# Patient Record
Sex: Female | Born: 1980 | Race: White | Hispanic: No | Marital: Married | Smoking: Former smoker
Health system: Southern US, Community
[De-identification: ages and names within clinical notes are randomized; demographics above are authoritative.]

## PROBLEM LIST (undated history)

## (undated) DIAGNOSIS — R42 Dizziness and giddiness: Secondary | ICD-10-CM

## (undated) DIAGNOSIS — G7000 Myasthenia gravis without (acute) exacerbation: Secondary | ICD-10-CM

## (undated) DIAGNOSIS — R0602 Shortness of breath: Secondary | ICD-10-CM

## (undated) DIAGNOSIS — J45909 Unspecified asthma, uncomplicated: Secondary | ICD-10-CM

## (undated) DIAGNOSIS — E039 Hypothyroidism, unspecified: Secondary | ICD-10-CM

## (undated) HISTORY — DX: Shortness of breath: R06.02

## (undated) HISTORY — DX: Myasthenia gravis without (acute) exacerbation: G70.00

## (undated) HISTORY — DX: Dizziness and giddiness: R42

---

## 2000-06-29 HISTORY — PX: CHOLECYSTECTOMY: SHX55

## 2003-06-30 HISTORY — PX: THYMECTOMY: SHX1063

## 2005-06-17 ENCOUNTER — Ambulatory Visit (HOSPITAL_COMMUNITY): Admission: RE | Admit: 2005-06-17 | Discharge: 2005-06-17 | Payer: Self-pay | Admitting: Neurology

## 2005-10-05 ENCOUNTER — Ambulatory Visit (HOSPITAL_COMMUNITY): Admission: RE | Admit: 2005-10-05 | Discharge: 2005-10-05 | Payer: Self-pay | Admitting: Thoracic Surgery

## 2005-10-06 ENCOUNTER — Encounter (HOSPITAL_COMMUNITY): Admission: RE | Admit: 2005-10-06 | Discharge: 2006-01-04 | Payer: Self-pay | Admitting: Neurology

## 2005-10-09 ENCOUNTER — Inpatient Hospital Stay (HOSPITAL_COMMUNITY): Admission: RE | Admit: 2005-10-09 | Discharge: 2005-10-20 | Payer: Self-pay | Admitting: Thoracic Surgery

## 2005-10-28 ENCOUNTER — Encounter: Admission: RE | Admit: 2005-10-28 | Discharge: 2005-10-28 | Payer: Self-pay | Admitting: Thoracic Surgery

## 2005-11-11 ENCOUNTER — Encounter: Admission: RE | Admit: 2005-11-11 | Discharge: 2005-11-11 | Payer: Self-pay | Admitting: Thoracic Surgery

## 2007-09-01 IMAGING — CR DG CHEST 1V PORT
1 series · 1 of 1 positions shown · non-contrast
Comparison: none

CLINICAL DATA: Myasthenia gravis.  Central line placement. 
 PORTABLE CHEST - 1 VIEW 10/05/05 AT 3110 HOURS:

[view not recorded]
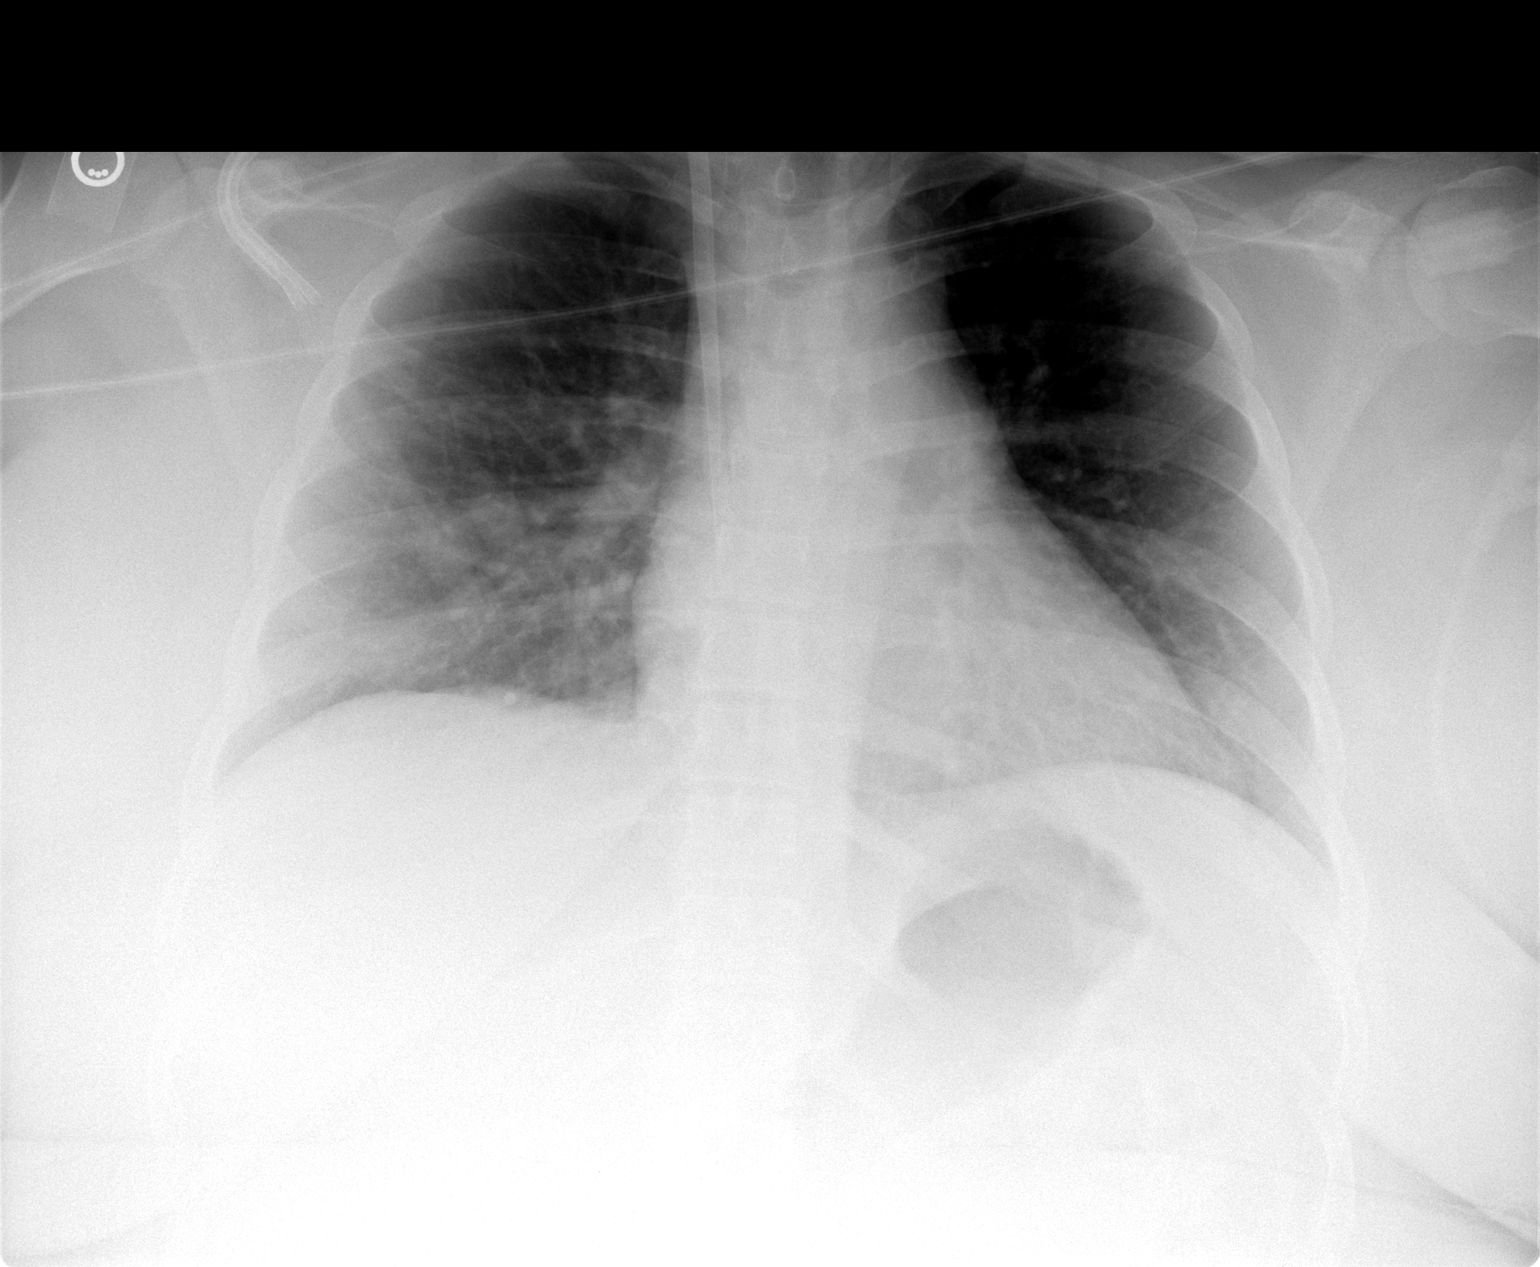

[1 of 1 positions shown; findings below may reference images not displayed]

FINDINGS: Compared to prior study at 0914 hours, there has been interval placement of a right jugular Diatek central venous catheter with tips in the distal superior vena cava and proximal right atrium. There is no evidence of pneumothorax.  
 Low lung volumes are seen however there is no evidence of acute infiltrate or pleural effusion.  Heart size is normal.
IMPRESSION: 1.  Right jugular dual lumen central venous catheter tips in the distal SVC and proximal right atrium.  No evidence of pneumothorax.  
 2.  No acute cardiopulmonary disease.

## 2007-09-08 IMAGING — CR DG CHEST 1V PORT
1 series · 1 of 1 positions shown · non-contrast
Comparison: 10/10/05.

CLINICAL DATA: Follow-up chest tube.  Myasthenia gravis.
 PORTABLE CHEST - 1 VIEW - 10/12/05:

[view not recorded]
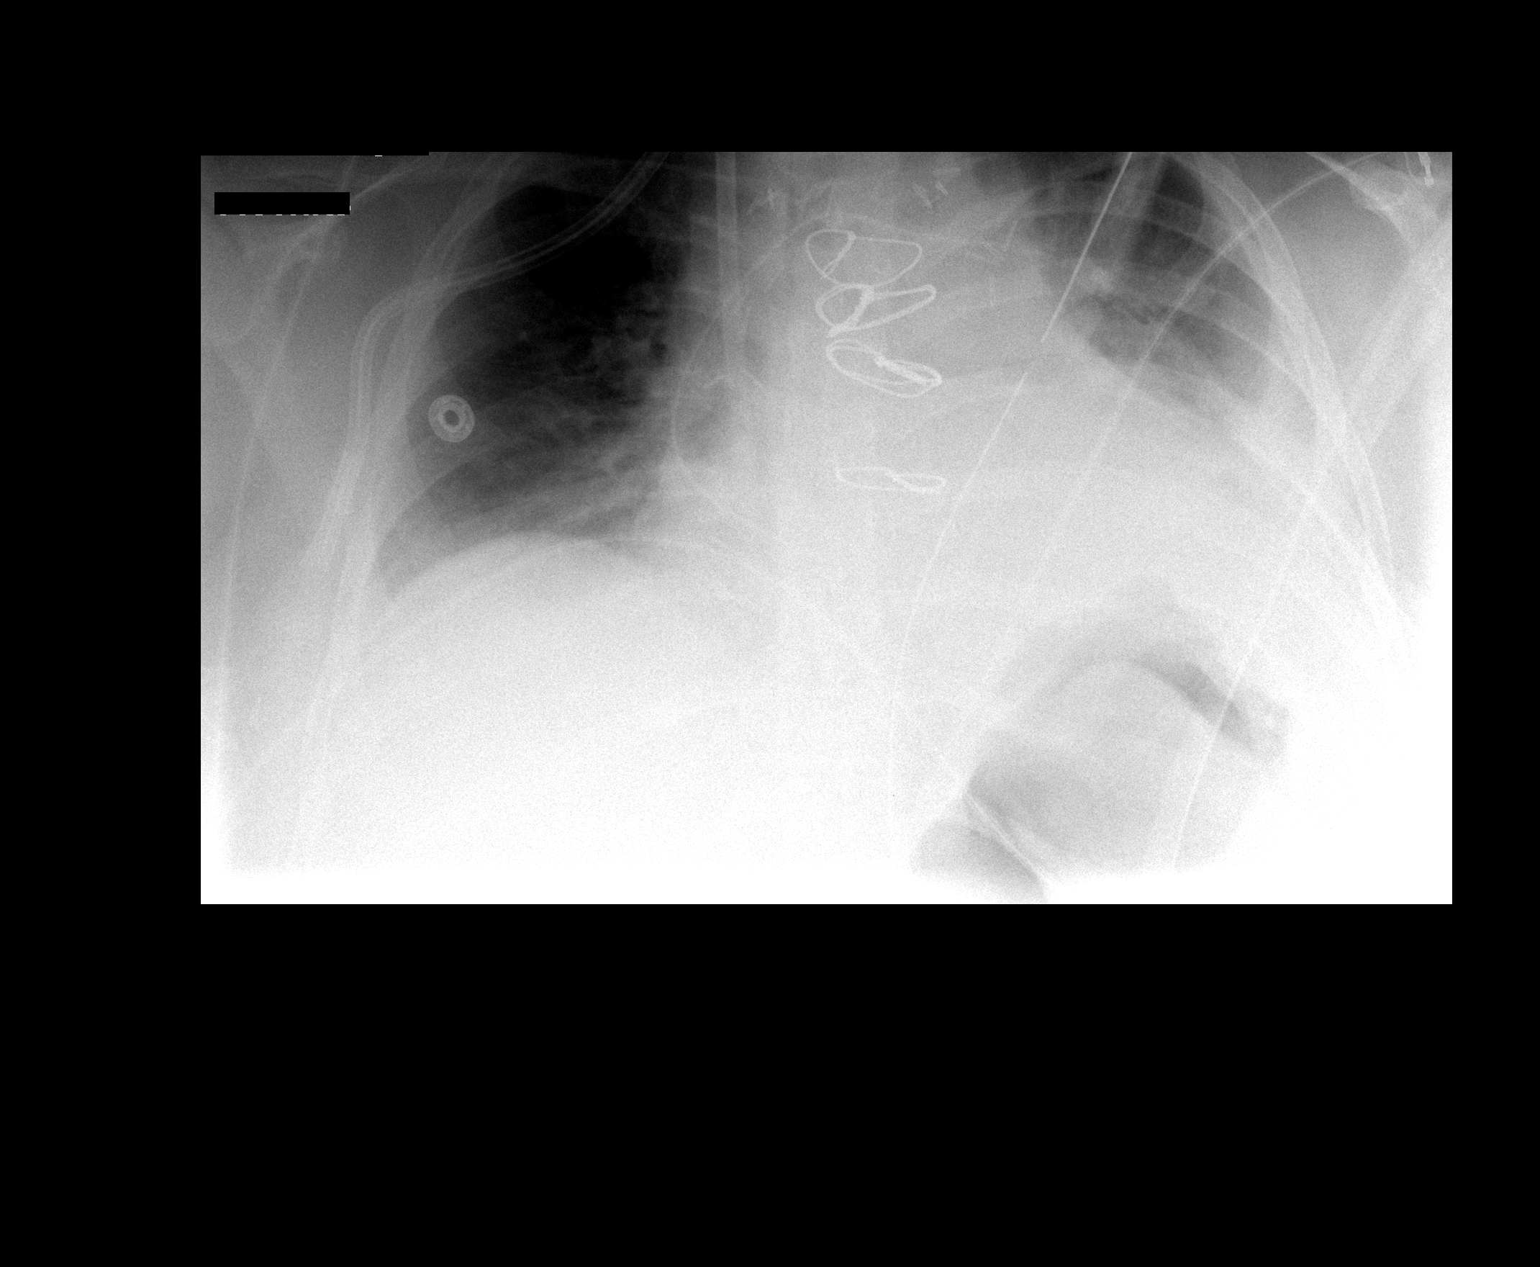

[1 of 1 positions shown; findings below may reference images not displayed]

FINDINGS: There is a large bore right IJ catheter which is unchanged in position.  The left-sided chest tube is in a similar position.  The heart is moderately enlarged and accentuated by a poor inspiratory effort.  There is slight increase in left base air space disease with a similar small left pleural effusion.  There is mild right base atelectasis as well.  No pneumothorax.  There is mild pulmonary venous congestion.
IMPRESSION: 1.  Slight decrease in lung aeration with increase in left base atelectasis and increased pulmonary venous congestion.
 2.  Support apparatus as above without evidence of pneumothorax.

## 2007-09-09 IMAGING — CR DG CHEST 1V PORT
1 series · 1 of 1 positions shown · non-contrast
Comparison: 10/12/05.

CLINICAL DATA: Status post VATS.  Chest tube in place.
 PORTABLE CHEST - 1 VIEW - 10/13/05:

[view not recorded]
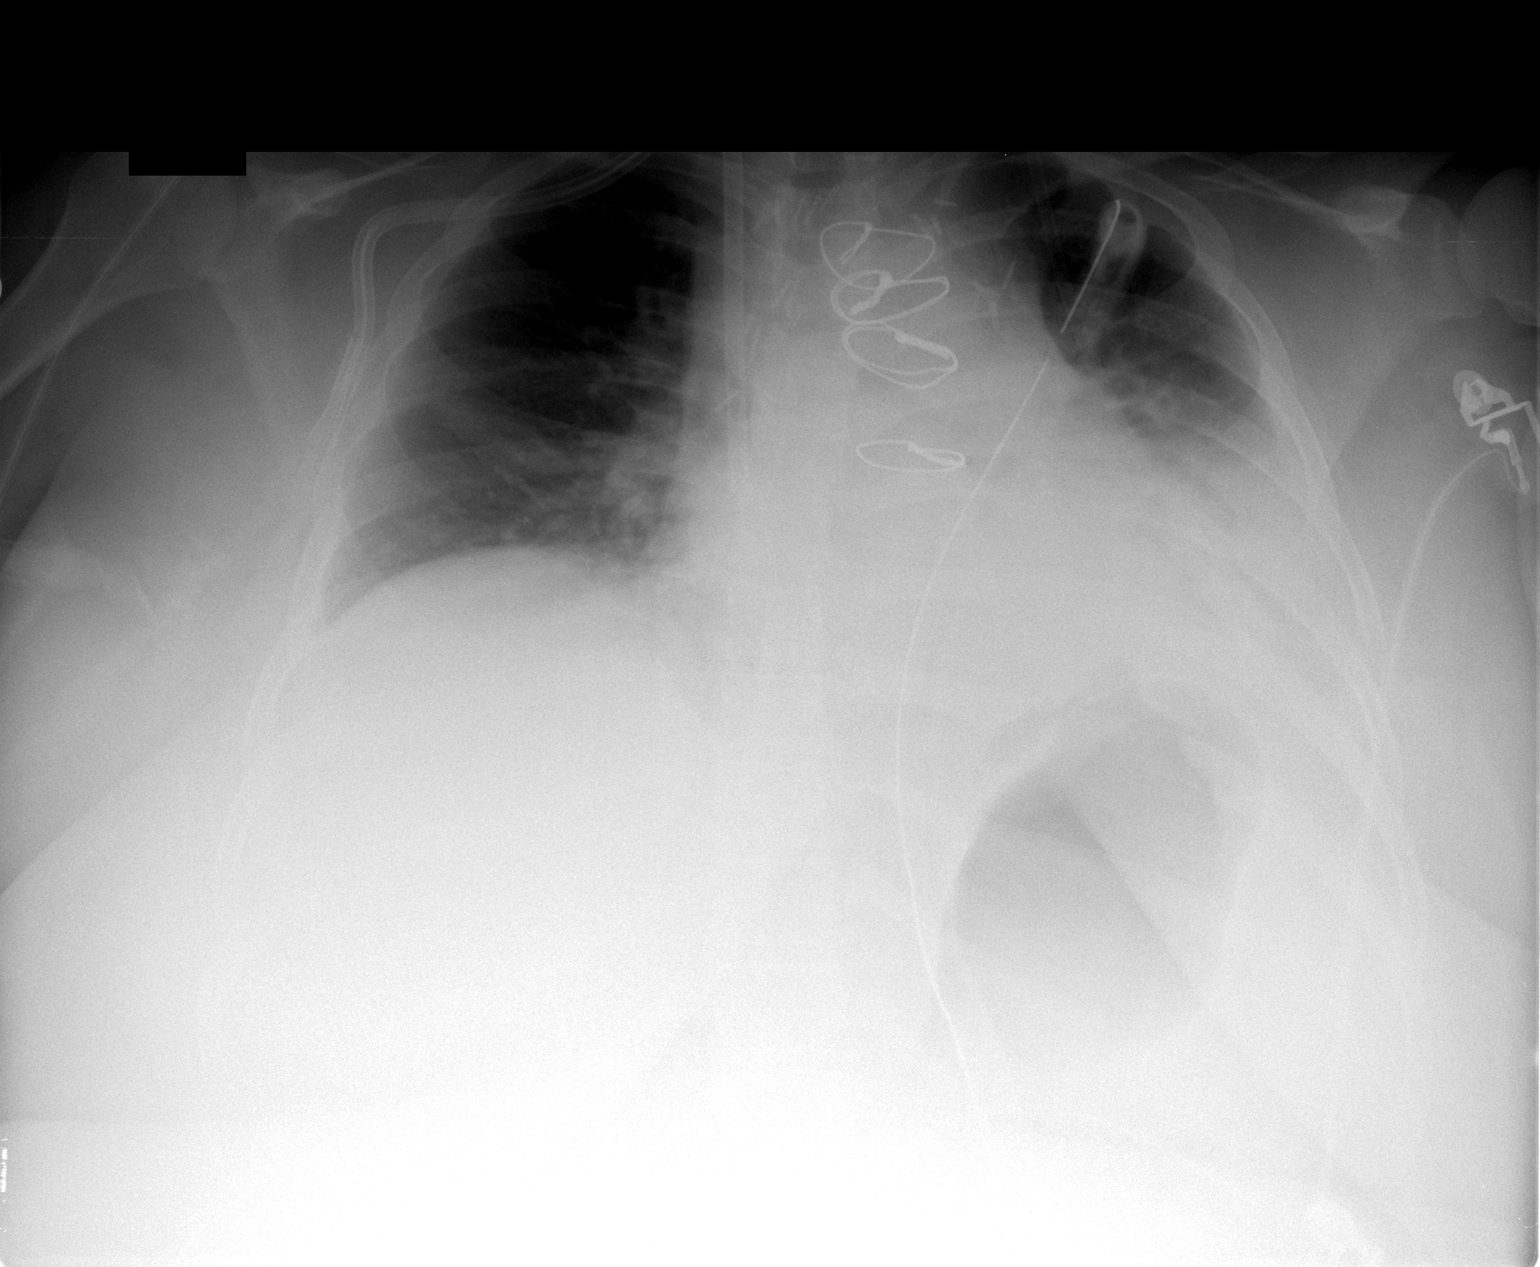

[1 of 1 positions shown; findings below may reference images not displayed]

FINDINGS: The patient has a left chest tube in place.  The right Diatek is again noted.  Left pleural effusion persists with left base atelectasis.  No pneumothorax.  The right lung is clear.
IMPRESSION: 1.  Left chest tube in place.  No pneumothorax.
 2.  Left effusion with bibasilar subsegmental atelectasis, left greater than right.

## 2009-06-29 HISTORY — PX: TUBAL LIGATION: SHX77

## 2010-07-20 ENCOUNTER — Encounter: Payer: Self-pay | Admitting: Thoracic Surgery

## 2015-09-29 DIAGNOSIS — S93401A Sprain of unspecified ligament of right ankle, initial encounter: Secondary | ICD-10-CM | POA: Diagnosis not present

## 2015-09-29 DIAGNOSIS — M25571 Pain in right ankle and joints of right foot: Secondary | ICD-10-CM | POA: Diagnosis not present

## 2015-09-29 DIAGNOSIS — F17200 Nicotine dependence, unspecified, uncomplicated: Secondary | ICD-10-CM | POA: Diagnosis not present

## 2015-09-29 DIAGNOSIS — S99911A Unspecified injury of right ankle, initial encounter: Secondary | ICD-10-CM | POA: Diagnosis not present

## 2016-10-21 DIAGNOSIS — H04123 Dry eye syndrome of bilateral lacrimal glands: Secondary | ICD-10-CM | POA: Diagnosis not present

## 2016-10-21 DIAGNOSIS — H40033 Anatomical narrow angle, bilateral: Secondary | ICD-10-CM | POA: Diagnosis not present

## 2017-03-29 HISTORY — PX: APPENDECTOMY: SHX54

## 2017-03-31 ENCOUNTER — Other Ambulatory Visit (HOSPITAL_COMMUNITY): Payer: Self-pay | Admitting: *Deleted

## 2017-03-31 ENCOUNTER — Ambulatory Visit (HOSPITAL_COMMUNITY)
Admission: RE | Admit: 2017-03-31 | Discharge: 2017-03-31 | Disposition: A | Discharge status: Home / Self Care | Payer: BLUE CROSS/BLUE SHIELD | Source: Ambulatory Visit | Attending: *Deleted | Admitting: *Deleted

## 2017-03-31 DIAGNOSIS — R279 Unspecified lack of coordination: Secondary | ICD-10-CM | POA: Diagnosis not present

## 2017-03-31 DIAGNOSIS — R1031 Right lower quadrant pain: Secondary | ICD-10-CM

## 2017-03-31 DIAGNOSIS — Z743 Need for continuous supervision: Secondary | ICD-10-CM | POA: Diagnosis not present

## 2017-03-31 DIAGNOSIS — K3580 Unspecified acute appendicitis: Secondary | ICD-10-CM | POA: Insufficient documentation

## 2017-03-31 DIAGNOSIS — E876 Hypokalemia: Secondary | ICD-10-CM | POA: Diagnosis not present

## 2017-03-31 DIAGNOSIS — Z6841 Body Mass Index (BMI) 40.0 and over, adult: Secondary | ICD-10-CM | POA: Diagnosis not present

## 2017-03-31 MED ORDER — IOPAMIDOL (ISOVUE-300) INJECTION 61%
100.0000 mL | Freq: Once | INTRAVENOUS | Status: AC | PRN
  Administered 2017-03-31: 100 mL via INTRAVENOUS

## 2017-04-01 DIAGNOSIS — K3580 Unspecified acute appendicitis: Secondary | ICD-10-CM | POA: Diagnosis not present

## 2017-04-02 DIAGNOSIS — E876 Hypokalemia: Secondary | ICD-10-CM | POA: Diagnosis not present

## 2017-04-02 DIAGNOSIS — K3580 Unspecified acute appendicitis: Secondary | ICD-10-CM | POA: Diagnosis not present

## 2017-08-19 DIAGNOSIS — R0602 Shortness of breath: Secondary | ICD-10-CM | POA: Diagnosis not present

## 2017-08-19 DIAGNOSIS — Z6841 Body Mass Index (BMI) 40.0 and over, adult: Secondary | ICD-10-CM | POA: Diagnosis not present

## 2017-08-19 DIAGNOSIS — J111 Influenza due to unidentified influenza virus with other respiratory manifestations: Secondary | ICD-10-CM | POA: Diagnosis not present

## 2017-08-19 DIAGNOSIS — R05 Cough: Secondary | ICD-10-CM | POA: Diagnosis not present

## 2017-10-20 DIAGNOSIS — H1013 Acute atopic conjunctivitis, bilateral: Secondary | ICD-10-CM | POA: Diagnosis not present

## 2017-10-20 DIAGNOSIS — H40033 Anatomical narrow angle, bilateral: Secondary | ICD-10-CM | POA: Diagnosis not present

## 2017-11-01 DIAGNOSIS — Z1389 Encounter for screening for other disorder: Secondary | ICD-10-CM | POA: Diagnosis not present

## 2017-11-01 DIAGNOSIS — Z1331 Encounter for screening for depression: Secondary | ICD-10-CM | POA: Diagnosis not present

## 2017-11-01 DIAGNOSIS — F3289 Other specified depressive episodes: Secondary | ICD-10-CM | POA: Diagnosis not present

## 2017-11-01 DIAGNOSIS — Z6841 Body Mass Index (BMI) 40.0 and over, adult: Secondary | ICD-10-CM | POA: Diagnosis not present

## 2017-11-24 DIAGNOSIS — M5441 Lumbago with sciatica, right side: Secondary | ICD-10-CM | POA: Diagnosis not present

## 2017-11-25 DIAGNOSIS — M5441 Lumbago with sciatica, right side: Secondary | ICD-10-CM | POA: Diagnosis not present

## 2017-11-26 DIAGNOSIS — Z6841 Body Mass Index (BMI) 40.0 and over, adult: Secondary | ICD-10-CM | POA: Diagnosis not present

## 2017-11-26 DIAGNOSIS — M5441 Lumbago with sciatica, right side: Secondary | ICD-10-CM | POA: Diagnosis not present

## 2017-11-26 DIAGNOSIS — M545 Low back pain: Secondary | ICD-10-CM | POA: Diagnosis not present

## 2017-11-26 DIAGNOSIS — M21371 Foot drop, right foot: Secondary | ICD-10-CM | POA: Diagnosis not present

## 2017-11-29 DIAGNOSIS — M5441 Lumbago with sciatica, right side: Secondary | ICD-10-CM | POA: Diagnosis not present

## 2017-12-01 DIAGNOSIS — M5441 Lumbago with sciatica, right side: Secondary | ICD-10-CM | POA: Diagnosis not present

## 2017-12-03 DIAGNOSIS — M5116 Intervertebral disc disorders with radiculopathy, lumbar region: Secondary | ICD-10-CM | POA: Diagnosis not present

## 2017-12-03 DIAGNOSIS — M5126 Other intervertebral disc displacement, lumbar region: Secondary | ICD-10-CM | POA: Diagnosis not present

## 2017-12-03 DIAGNOSIS — M545 Low back pain: Secondary | ICD-10-CM | POA: Diagnosis not present

## 2017-12-03 DIAGNOSIS — M5127 Other intervertebral disc displacement, lumbosacral region: Secondary | ICD-10-CM | POA: Diagnosis not present

## 2017-12-03 DIAGNOSIS — M4807 Spinal stenosis, lumbosacral region: Secondary | ICD-10-CM | POA: Diagnosis not present

## 2017-12-03 DIAGNOSIS — Q7649 Other congenital malformations of spine, not associated with scoliosis: Secondary | ICD-10-CM | POA: Diagnosis not present

## 2017-12-03 DIAGNOSIS — M21371 Foot drop, right foot: Secondary | ICD-10-CM | POA: Diagnosis not present

## 2017-12-06 DIAGNOSIS — M5441 Lumbago with sciatica, right side: Secondary | ICD-10-CM | POA: Diagnosis not present

## 2017-12-09 DIAGNOSIS — M5441 Lumbago with sciatica, right side: Secondary | ICD-10-CM | POA: Diagnosis not present

## 2017-12-13 DIAGNOSIS — M21371 Foot drop, right foot: Secondary | ICD-10-CM | POA: Diagnosis not present

## 2017-12-13 DIAGNOSIS — M545 Low back pain: Secondary | ICD-10-CM | POA: Diagnosis not present

## 2017-12-13 DIAGNOSIS — M5126 Other intervertebral disc displacement, lumbar region: Secondary | ICD-10-CM | POA: Diagnosis not present

## 2017-12-13 DIAGNOSIS — M5441 Lumbago with sciatica, right side: Secondary | ICD-10-CM | POA: Diagnosis not present

## 2017-12-13 DIAGNOSIS — F3289 Other specified depressive episodes: Secondary | ICD-10-CM | POA: Diagnosis not present

## 2017-12-16 DIAGNOSIS — M5441 Lumbago with sciatica, right side: Secondary | ICD-10-CM | POA: Diagnosis not present

## 2017-12-20 DIAGNOSIS — M5441 Lumbago with sciatica, right side: Secondary | ICD-10-CM | POA: Diagnosis not present

## 2017-12-23 DIAGNOSIS — M5441 Lumbago with sciatica, right side: Secondary | ICD-10-CM | POA: Diagnosis not present

## 2017-12-27 DIAGNOSIS — M5441 Lumbago with sciatica, right side: Secondary | ICD-10-CM | POA: Diagnosis not present

## 2017-12-28 DIAGNOSIS — Z6841 Body Mass Index (BMI) 40.0 and over, adult: Secondary | ICD-10-CM | POA: Diagnosis not present

## 2017-12-28 DIAGNOSIS — R030 Elevated blood-pressure reading, without diagnosis of hypertension: Secondary | ICD-10-CM | POA: Diagnosis not present

## 2017-12-28 DIAGNOSIS — M5126 Other intervertebral disc displacement, lumbar region: Secondary | ICD-10-CM | POA: Diagnosis not present

## 2018-01-03 DIAGNOSIS — M5441 Lumbago with sciatica, right side: Secondary | ICD-10-CM | POA: Diagnosis not present

## 2018-01-06 DIAGNOSIS — M5441 Lumbago with sciatica, right side: Secondary | ICD-10-CM | POA: Diagnosis not present

## 2018-01-17 DIAGNOSIS — M5441 Lumbago with sciatica, right side: Secondary | ICD-10-CM | POA: Diagnosis not present

## 2018-01-19 DIAGNOSIS — M5441 Lumbago with sciatica, right side: Secondary | ICD-10-CM | POA: Diagnosis not present

## 2018-01-21 DIAGNOSIS — M5126 Other intervertebral disc displacement, lumbar region: Secondary | ICD-10-CM | POA: Diagnosis not present

## 2018-03-07 DIAGNOSIS — M21371 Foot drop, right foot: Secondary | ICD-10-CM | POA: Diagnosis not present

## 2018-03-07 DIAGNOSIS — Z6841 Body Mass Index (BMI) 40.0 and over, adult: Secondary | ICD-10-CM | POA: Diagnosis not present

## 2018-03-07 DIAGNOSIS — F3289 Other specified depressive episodes: Secondary | ICD-10-CM | POA: Diagnosis not present

## 2018-03-07 DIAGNOSIS — M545 Low back pain: Secondary | ICD-10-CM | POA: Diagnosis not present

## 2018-04-04 DIAGNOSIS — G4700 Insomnia, unspecified: Secondary | ICD-10-CM | POA: Diagnosis not present

## 2018-04-04 DIAGNOSIS — M545 Low back pain: Secondary | ICD-10-CM | POA: Diagnosis not present

## 2018-04-04 DIAGNOSIS — F3289 Other specified depressive episodes: Secondary | ICD-10-CM | POA: Diagnosis not present

## 2018-04-04 DIAGNOSIS — Z6841 Body Mass Index (BMI) 40.0 and over, adult: Secondary | ICD-10-CM | POA: Diagnosis not present

## 2018-05-30 DIAGNOSIS — F3289 Other specified depressive episodes: Secondary | ICD-10-CM | POA: Diagnosis not present

## 2018-05-30 DIAGNOSIS — M545 Low back pain: Secondary | ICD-10-CM | POA: Diagnosis not present

## 2018-05-30 DIAGNOSIS — Z6841 Body Mass Index (BMI) 40.0 and over, adult: Secondary | ICD-10-CM | POA: Diagnosis not present

## 2018-08-25 DIAGNOSIS — J111 Influenza due to unidentified influenza virus with other respiratory manifestations: Secondary | ICD-10-CM | POA: Diagnosis not present

## 2018-08-25 DIAGNOSIS — Z6841 Body Mass Index (BMI) 40.0 and over, adult: Secondary | ICD-10-CM | POA: Diagnosis not present

## 2018-09-28 DIAGNOSIS — J329 Chronic sinusitis, unspecified: Secondary | ICD-10-CM | POA: Diagnosis not present

## 2018-09-28 DIAGNOSIS — F3289 Other specified depressive episodes: Secondary | ICD-10-CM | POA: Diagnosis not present

## 2018-09-28 DIAGNOSIS — M545 Low back pain: Secondary | ICD-10-CM | POA: Diagnosis not present

## 2018-12-12 DIAGNOSIS — Z6841 Body Mass Index (BMI) 40.0 and over, adult: Secondary | ICD-10-CM | POA: Diagnosis not present

## 2018-12-12 DIAGNOSIS — M7650 Patellar tendinitis, unspecified knee: Secondary | ICD-10-CM | POA: Diagnosis not present

## 2018-12-12 DIAGNOSIS — M25561 Pain in right knee: Secondary | ICD-10-CM | POA: Diagnosis not present

## 2019-01-27 DIAGNOSIS — H40033 Anatomical narrow angle, bilateral: Secondary | ICD-10-CM | POA: Diagnosis not present

## 2019-01-27 DIAGNOSIS — H04123 Dry eye syndrome of bilateral lacrimal glands: Secondary | ICD-10-CM | POA: Diagnosis not present

## 2019-03-16 DIAGNOSIS — F411 Generalized anxiety disorder: Secondary | ICD-10-CM | POA: Diagnosis not present

## 2019-03-16 DIAGNOSIS — F329 Major depressive disorder, single episode, unspecified: Secondary | ICD-10-CM | POA: Diagnosis not present

## 2019-03-16 DIAGNOSIS — Z6841 Body Mass Index (BMI) 40.0 and over, adult: Secondary | ICD-10-CM | POA: Diagnosis not present

## 2019-04-25 ENCOUNTER — Other Ambulatory Visit: Payer: Self-pay | Admitting: *Deleted

## 2019-04-25 DIAGNOSIS — Z20828 Contact with and (suspected) exposure to other viral communicable diseases: Secondary | ICD-10-CM | POA: Diagnosis not present

## 2019-04-25 DIAGNOSIS — Z20822 Contact with and (suspected) exposure to covid-19: Secondary | ICD-10-CM

## 2019-04-26 LAB — NOVEL CORONAVIRUS, NAA: SARS-CoV-2, NAA: NOT DETECTED

## 2019-04-27 ENCOUNTER — Telehealth: Payer: Self-pay | Admitting: *Deleted

## 2019-04-27 NOTE — Telephone Encounter (Signed)
Negative COVID results given. Patient results "NOT Detected." Caller expressed understanding. ° °

## 2019-05-26 DIAGNOSIS — K047 Periapical abscess without sinus: Secondary | ICD-10-CM | POA: Diagnosis not present

## 2019-05-30 DIAGNOSIS — U071 COVID-19: Secondary | ICD-10-CM

## 2019-05-30 HISTORY — DX: COVID-19: U07.1

## 2019-06-09 DIAGNOSIS — Z20828 Contact with and (suspected) exposure to other viral communicable diseases: Secondary | ICD-10-CM | POA: Diagnosis not present

## 2019-06-15 DIAGNOSIS — U071 COVID-19: Secondary | ICD-10-CM | POA: Diagnosis not present

## 2019-09-06 DIAGNOSIS — Z0000 Encounter for general adult medical examination without abnormal findings: Secondary | ICD-10-CM | POA: Diagnosis not present

## 2019-09-06 DIAGNOSIS — R9081 Abnormal echoencephalogram: Secondary | ICD-10-CM | POA: Diagnosis not present

## 2019-09-06 DIAGNOSIS — Z122 Encounter for screening for malignant neoplasm of respiratory organs: Secondary | ICD-10-CM | POA: Diagnosis not present

## 2019-09-06 DIAGNOSIS — E039 Hypothyroidism, unspecified: Secondary | ICD-10-CM | POA: Diagnosis not present

## 2019-09-06 DIAGNOSIS — R5383 Other fatigue: Secondary | ICD-10-CM | POA: Diagnosis not present

## 2019-09-06 DIAGNOSIS — Z6841 Body Mass Index (BMI) 40.0 and over, adult: Secondary | ICD-10-CM | POA: Diagnosis not present

## 2019-10-11 DIAGNOSIS — R42 Dizziness and giddiness: Secondary | ICD-10-CM | POA: Diagnosis not present

## 2019-10-11 DIAGNOSIS — R0602 Shortness of breath: Secondary | ICD-10-CM | POA: Diagnosis not present

## 2019-10-11 DIAGNOSIS — R21 Rash and other nonspecific skin eruption: Secondary | ICD-10-CM | POA: Diagnosis not present

## 2019-11-13 DIAGNOSIS — F411 Generalized anxiety disorder: Secondary | ICD-10-CM | POA: Diagnosis not present

## 2019-11-13 DIAGNOSIS — R5382 Chronic fatigue, unspecified: Secondary | ICD-10-CM | POA: Diagnosis not present

## 2019-11-22 ENCOUNTER — Encounter: Payer: Self-pay | Admitting: *Deleted

## 2019-11-24 ENCOUNTER — Encounter: Payer: Self-pay | Admitting: Neurology

## 2019-11-24 ENCOUNTER — Ambulatory Visit: Payer: BC Managed Care – PPO | Admitting: Neurology

## 2019-12-27 DIAGNOSIS — E6609 Other obesity due to excess calories: Secondary | ICD-10-CM | POA: Diagnosis not present

## 2020-01-15 DIAGNOSIS — R946 Abnormal results of thyroid function studies: Secondary | ICD-10-CM | POA: Diagnosis not present

## 2020-01-15 DIAGNOSIS — R5382 Chronic fatigue, unspecified: Secondary | ICD-10-CM | POA: Diagnosis not present

## 2020-02-15 DIAGNOSIS — Z23 Encounter for immunization: Secondary | ICD-10-CM | POA: Diagnosis not present

## 2020-03-14 DIAGNOSIS — Z23 Encounter for immunization: Secondary | ICD-10-CM | POA: Diagnosis not present

## 2020-03-28 DIAGNOSIS — F5081 Binge eating disorder: Secondary | ICD-10-CM | POA: Diagnosis not present

## 2020-03-28 DIAGNOSIS — Z6841 Body Mass Index (BMI) 40.0 and over, adult: Secondary | ICD-10-CM | POA: Diagnosis not present

## 2020-03-28 DIAGNOSIS — F411 Generalized anxiety disorder: Secondary | ICD-10-CM | POA: Diagnosis not present

## 2020-04-01 DIAGNOSIS — R946 Abnormal results of thyroid function studies: Secondary | ICD-10-CM | POA: Diagnosis not present

## 2020-05-28 DIAGNOSIS — R946 Abnormal results of thyroid function studies: Secondary | ICD-10-CM | POA: Diagnosis not present

## 2020-08-27 ENCOUNTER — Other Ambulatory Visit (HOSPITAL_COMMUNITY): Payer: Self-pay | Admitting: Family Medicine

## 2020-08-27 DIAGNOSIS — R002 Palpitations: Secondary | ICD-10-CM

## 2020-08-27 DIAGNOSIS — R0602 Shortness of breath: Secondary | ICD-10-CM

## 2020-09-18 ENCOUNTER — Ambulatory Visit (HOSPITAL_COMMUNITY)
Admission: RE | Admit: 2020-09-18 | Discharge: 2020-09-18 | Disposition: A | Discharge status: Home / Self Care | Payer: 59 | Source: Ambulatory Visit | Attending: Family Medicine | Admitting: Family Medicine

## 2020-09-18 ENCOUNTER — Other Ambulatory Visit: Payer: Self-pay

## 2020-09-18 DIAGNOSIS — R0602 Shortness of breath: Secondary | ICD-10-CM | POA: Diagnosis not present

## 2020-09-18 DIAGNOSIS — R002 Palpitations: Secondary | ICD-10-CM | POA: Diagnosis present

## 2020-09-18 MED ORDER — IOHEXOL 350 MG/ML SOLN
100.0000 mL | Freq: Once | INTRAVENOUS | Status: AC | PRN
  Administered 2020-09-18: 100 mL via INTRAVENOUS

## 2021-03-19 ENCOUNTER — Institutional Professional Consult (permissible substitution): Payer: 59 | Admitting: Pulmonary Disease

## 2021-04-18 ENCOUNTER — Encounter: Payer: Self-pay | Admitting: Student

## 2021-04-18 ENCOUNTER — Ambulatory Visit (INDEPENDENT_AMBULATORY_CARE_PROVIDER_SITE_OTHER): Payer: 59

## 2021-04-18 ENCOUNTER — Other Ambulatory Visit: Payer: Self-pay

## 2021-04-18 ENCOUNTER — Ambulatory Visit (INDEPENDENT_AMBULATORY_CARE_PROVIDER_SITE_OTHER): Payer: 59 | Admitting: Student

## 2021-04-18 VITALS — BP 120/70 | HR 53 | Temp 98.4°F | Ht 63.0 in | Wt 283.4 lb

## 2021-04-18 DIAGNOSIS — U099 Post covid-19 condition, unspecified: Secondary | ICD-10-CM

## 2021-04-18 DIAGNOSIS — R0609 Other forms of dyspnea: Secondary | ICD-10-CM

## 2021-04-18 NOTE — Patient Instructions (Addendum)
-   STOP vaping - PFTs in next 1-2 weeks - CXR today - pulmonary rehab referral placed

## 2021-04-18 NOTE — Progress Notes (Signed)
Synopsis: Referred for dyspnea by Lianne Moris, PA-C  Subjective:   PATIENT ID: Cynthia Neal GENDER: female DOB: Oct 30, 1980, MRN: [id number]  Chief Complaint  Patient presents with   Consult    [facility] with any activity.  This started back 05/2019 when she had her first bout with COVID.  Had covid again earlier this year.  SHOB has been about the same.  She has been using the incentive spirometer.     40yF s/p thymectomy, MG previously followed by neurology and thoracic surgery nearly 42 ya - was on steroids, IVIG, mestinon - had mostly profound weakness less in way of bulbar symptoms, hypothyroid, ?tachyarrhythmia after covid maintained on metoprolol with history of covid-19 infection 06/2018 and persistent dyspnea.  Dr. Noralyn Pick prescribed metoprolol (her PCP at [facility] in [place]).   She says she had covid the first time in 05/2019 (not hospitalized), since then has had long haul symptoms with brain fog, DOE, palpitations/heart racing. DOE to 1 flight of stairs, it is improving very slowly. She has tried symbicort but didn't find it helpful - was on it for 30 days about 6 months ago. Never had asthma as a kid. She has not had an echo. She has a little cough but not significant. No BLE edema. No history of blood clot, not on OCP. She does not really have orthopnea. She has some CP which has improved. It is exertional. Gets better when she rests. Has not had stress test.   Got covid-19 again beginning of 2022. Again, not hospitalized.  She snores. Has never had a sleep study. No PND. Doesn't feel excessively sleepy when she is at work at night     Otherwise pertinent review of systems is negative.  No family history of lung disease.  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 Never has lived outside of [place]. She has a dog. No water damage at home. No recent travel. Vaping just nicotine. Delta8 sometimes. Smoked off/on for 20 years - 3ppd at one point, quit 2021.  Past Medical History:   Diagnosis Date   COVID-19 05/2019   SOB (shortness of breath)    Vertigo      History reviewed. No pertinent family history.   Past Surgical History:  Procedure Laterality Date   APPENDECTOMY  03/2017   THYMECTOMY  2005   TUBAL LIGATION Bilateral 2011    Social History   Socioeconomic History   Marital status: Married    Spouse name: Not on file   Number of children: Not on file   Years of education: Not on file   Highest education level: Not on file  Occupational History   Not on file  Tobacco Use   Smoking status: Former    Packs/day: 0.50    Years: 20.00    Pack years: 10.00    Types: Cigarettes    Quit date: 04/08/2020    Years since quitting: 1.0   Smokeless tobacco: Current   Tobacco comments:    Uses a vape daily  Substance and Sexual Activity   Alcohol use: Not on file   Drug use: Not on file   Sexual activity: Not on file  Other Topics Concern   Not on file  Social History Narrative   Not on file   Social Determinants of Health   Financial Resource Strain: Not on file  Food Insecurity: Not on file  Transportation Needs: Not on file  Physical Activity: Not on file  Stress: Not on file  Social  Connections: Not on file  Intimate Partner Violence: Not on file     No Known Allergies   Outpatient Medications Prior to Visit  Medication Sig Dispense Refill   escitalopram (LEXAPRO) 20 MG tablet Take 20 mg by mouth 2 (two) times daily.     levothyroxine (SYNTHROID) 88 MCG tablet Take 88 mcg by mouth daily.     metoprolol succinate (TOPROL-XL) 25 MG 24 hr tablet Take 25 mg by mouth daily.     No facility-administered medications prior to visit.       Objective:   Physical Exam:  General appearance: 40 y.o., female, NAD, conversant  Eyes: anicteric sclerae, moist conjunctivae; no lid-lag; PERRL, tracking appropriately HENT: NCAT; oropharynx, MMM, no mucosal ulcerations; normal hard and soft palate Neck: Trachea midline; no lymphadenopathy,  no JVD Lungs: CTAB, no crackles, no wheeze, with normal respiratory effort CV: RRR, no MRGs  Abdomen: Soft, non-tender; non-distended, BS present  Extremities: No peripheral edema, radial and DP pulses present bilaterally  Skin: Normal temperature, turgor and texture; no rash Psych: Appropriate affect Neuro: Alert and oriented to person and place, no focal deficit    Vitals:   04/18/21 1539  BP: 120/70  Pulse: (!) 53  Temp: 98.4 F (36.9 C)  TempSrc: Oral  SpO2: 99%  Weight: 283 lb 6 oz (128.5 kg)  Height: 5\' 3"  (1.6 m)   99% on RA BMI Readings from Last 3 Encounters:  04/18/21 50.20 kg/m   Wt Readings from Last 3 Encounters:  04/18/21 283 lb 6 oz (128.5 kg)     CBC No results found for: WBC, RBC, HGB, HCT, PLT, MCV, MCH, MCHC, RDW, LYMPHSABS, MONOABS, EOSABS, BASOSABS  OSH Hb 10.9 OSH Eos 100 OSH CMP WNL  OSH TSH WNL  Chest Imaging: CTA Chest 09/18/20 reviewed by me, unremarkable  CXR today unremarkable  Pulmonary Functions Testing Results: No flowsheet data found.     Assessment & Plan:   # DOE:  May represent long haul covid-19 but alternative possibilities include deconditioning, asthma/reactive airways, angina, sleep-disordered breathing. Covid-related ILD or vaping related lung injury seems less likely given normal-appearing CXR on my read.   Plan: - BNP - PFTs - discuss PSG next visit especially if above workup unrevealing. Also consider echo, stress testing given reported exertional CP/tightness     09/20/20, MD [facility] 04/18/2021 4:50 PM

## 2021-04-19 LAB — BRAIN NATRIURETIC PEPTIDE: Brain Natriuretic Peptide: 50 pg/mL (ref <100)

## 2021-04-22 ENCOUNTER — Other Ambulatory Visit: Payer: Self-pay

## 2021-04-22 ENCOUNTER — Ambulatory Visit (INDEPENDENT_AMBULATORY_CARE_PROVIDER_SITE_OTHER): Payer: 59 | Admitting: Student

## 2021-04-22 DIAGNOSIS — R0609 Other forms of dyspnea: Secondary | ICD-10-CM | POA: Diagnosis not present

## 2021-04-22 LAB — PULMONARY FUNCTION TEST
DL/VA % pred: 102 %
DL/VA: 4.57 ml/min/mmHg/L
DLCO cor % pred: 87 %
DLCO cor: 18.59 ml/min/mmHg
DLCO unc % pred: 87 %
DLCO unc: 18.59 ml/min/mmHg
FEF 25-75 Post: 3.18 L/sec
FEF 25-75 Pre: 2.77 L/sec
FEF2575-%Change-Post: 14 %
FEF2575-%Pred-Post: 103 %
FEF2575-%Pred-Pre: 90 %
FEV1-%Change-Post: 3 %
FEV1-%Pred-Post: 90 %
FEV1-%Pred-Pre: 87 %
FEV1-Post: 2.64 L
FEV1-Pre: 2.56 L
FEV1FVC-%Change-Post: 4 %
FEV1FVC-%Pred-Pre: 101 %
FEV6-%Change-Post: -1 %
FEV6-%Pred-Post: 85 %
FEV6-%Pred-Pre: 86 %
FEV6-Post: 3 L
FEV6-Pre: 3.04 L
FEV6FVC-%Change-Post: 0 %
FEV6FVC-%Pred-Post: 101 %
FEV6FVC-%Pred-Pre: 101 %
FVC-%Change-Post: -1 %
FVC-%Pred-Post: 83 %
FVC-%Pred-Pre: 85 %
FVC-Post: 3 L
FVC-Pre: 3.05 L
Post FEV1/FVC ratio: 88 %
Post FEV6/FVC ratio: 100 %
Pre FEV1/FVC ratio: 84 %
Pre FEV6/FVC Ratio: 100 %
RV % pred: 75 %
RV: 1.16 L
TLC % pred: 85 %
TLC: 4.19 L

## 2021-04-22 NOTE — Progress Notes (Signed)
PFT done today. 

## 2022-06-10 LAB — LAB REPORT - SCANNED: EGFR: 112

## 2022-08-05 ENCOUNTER — Encounter (INDEPENDENT_AMBULATORY_CARE_PROVIDER_SITE_OTHER): Payer: Self-pay | Admitting: *Deleted

## 2022-08-20 ENCOUNTER — Encounter (INDEPENDENT_AMBULATORY_CARE_PROVIDER_SITE_OTHER): Payer: Self-pay | Admitting: Gastroenterology

## 2022-08-27 ENCOUNTER — Encounter (INDEPENDENT_AMBULATORY_CARE_PROVIDER_SITE_OTHER): Payer: Self-pay | Admitting: Gastroenterology

## 2022-09-21 NOTE — H&P (View-Only) (Signed)
  GI Office Note    Referring Provider: Carroll, Erin, PA-C Primary Care Physician:  Carroll, Erin, PA-C  Primary Gastroenterologist: Robert M.Rourk, MD  Chief Complaint   Chief Complaint  Patient presents with   Nausea    Patient here today due to issues with nausea, with occasional vomiting. She has had some unexpected weight loss,gagging, and dysphagia with a feeling of something lodged in her throat. She is not on a ppi, she did try omeprazole,but she could not swallow them as they made her gag. Denies any abdominal pain.    History of Present Illness   Cynthia Neal is a 42 y.o. female presenting today at the request of Carroll, Erin, PA-C for nausea, weight los, dry heaving.   Per review of chart it appears patient underwent laparoscopic appendectomy in 2018.  Review PCP: From visit dated 07/31/2022 patient presented with follow-up of hypothyroidism. Patient also with complaint of dysphagia has gotten worse since she had COVID as foods do not agree with her and she is gagging with certain foods and not sure if this is a texture issue..  For anxiety and depression she was added Abilify to her Lexapro.  Seeing a therapist.  No longer taking Adderall and has not been taking Toprol for palpitations.  Had CTA that was negative for PE for shortness of breath.  Send this is been ongoing and worsening since having COVID in December 2020.  Currently on Symbicort.  Taking iron daily for anemia.  No relief with omeprazole for GERD dysphagia nausea, using Zofran as needed.  Referred to GI.  Patient previously did states she was not taking omeprazole due to being unable to swallow it due to gagging.  It appears it was also present at her visit in December 2023.  Labs December 2023 with albumin 3.4, normal LFTs and renal function.  Iron panel within normal limits.  Hemoglobin 11.5.  TSH 9.6.  Lipid panel within normal limits.  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   Has a feeling of something stuck in her throat after swallowing. Usually only an issue with solids. Has not had any GI evaluation for her swallowing. No recent chest or abdominal imaging.   Denies change in her bowel habits. Alternate between constipation and diarrhea. No melena or brbpr. Rare abdominal. Denies reflux symptoms. Tried omeprazole for about 2 weeks and did not seem to make a difference. Just recently had her synthroid dose decreased.   Paternal grandfather with colon cancer at age 65.   Gagging at the site of food and gagging with it touching her tongue. She is unable to eat things she used to be able to eat. Has lost about 60 lbs since Thanksgiving.   Nausea is on a daily basis. Vomiting occurs usually when food does not seem to agree with her. Does not have an appetite. Has been forcing herself to eat. Her husband fixes her food and brings it to her and she will attempt to eat. Mostly eating bread and meat. Able to eat chicken and steak and hamburger. Any vegetable or weird textures (soft/mushy/greasy) textures are bothersome to her. No specific instance that she can identify as a trigger. Zofran is helping with nausea. More so having regurgitation  after trying to eat and has some mild morning vomiting with emesis that is clear or yellow in color.     Had her gallbladder removed in 2002.   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   LMP 2 weeks prior.    Current Outpatient Medications  Medication Sig Dispense Refill   albuterol (VENTOLIN HFA) 108 (90 Base) MCG/ACT inhaler Inhale 2 puffs into the lungs every 6 (six) hours as needed for wheezing or shortness of breath.     ARIPiprazole (ABILIFY) 5 MG tablet Take 5 mg by mouth at bedtime.     buPROPion ER (WELLBUTRIN SR) 100 MG 12 hr tablet Take 100 mg by mouth 2 (two) times daily.     escitalopram (LEXAPRO) 20  MG tablet Take 20 mg by mouth 2 (two) times daily.     levothyroxine (SYNTHROID) 88 MCG tablet Take 88 mcg by mouth daily.     ondansetron (ZOFRAN) 8 MG tablet Take 8 mg by mouth every 8 (eight) hours as needed for nausea or vomiting.     No current facility-administered medications for this visit.    Past Medical History:  Diagnosis Date   COVID-19 05/2019   Myasthenia (HCC)    in remission s/p thymectomy   SOB (shortness of breath)    Vertigo     Past Surgical History:  Procedure Laterality Date   APPENDECTOMY  03/2017   CHOLECYSTECTOMY  2002   THYMECTOMY  2005   TUBAL LIGATION Bilateral 2011    Family History  Problem Relation Age of Onset   Colon cancer Paternal Grandfather    Colon polyps Neg Hx     Allergies as of 09/22/2022   (No Known Allergies)    Social History   Socioeconomic History   Marital status: Married    Spouse name: Not on file   Number of children: Not on file   Years of education: Not on file   Highest education level: Not on file  Occupational History   Not on file  Tobacco Use   Smoking status: Former    Packs/day: 0.50    Years: 20.00    Additional pack years: 0.00    Total pack years: 10.00    Types: Cigarettes    Quit date: 04/08/2020    Years since quitting: 2.4   Smokeless tobacco: Current   Tobacco comments:    Uses a vape daily  Vaping Use   Vaping Use: Every day  Substance and Sexual Activity   Alcohol use: Not Currently   Drug use: Not Currently   Sexual activity: Not on file  Other Topics Concern   Not on file  Social History Narrative   Not on file   Social Determinants of Health   Financial Resource Strain: Not on file  Food Insecurity: Not on file  Transportation Needs: Not on file  Physical Activity: Not on file  Stress: Not on file  Social Connections: Not on file  Intimate Partner Violence: Not on file     Review of Systems   Gen: + weakness. Denies any fever, chills, fatigue, weight loss, lack of  appetite.  CV: Denies chest pain, heart palpitations, peripheral edema, syncope.  Resp: Denies shortness of breath at rest or with exertion. Denies wheezing or cough.  GI: see HPI GU : Denies urinary burning, urinary frequency, urinary hesitancy MS: Denies joint pain, cramps, or limitation of movement.  Derm: Denies rash, itching, dry skin Psych: Denies depression, anxiety, memory loss, and confusion Heme: Denies bruising, bleeding, and enlarged lymph nodes.   Physical Exam   BP   107/76 (BP Location: Left Arm, Patient Position: Sitting, Cuff Size: Large)   Pulse 80   Temp (!) 97.5 F (36.4 C) (Temporal)   Ht 5' 2" (1.575 m)   Wt 230 lb 3.2 oz (104.4 kg)   LMP 09/07/2022 (Approximate)   BMI 42.10 kg/m   General:   Alert and oriented. Pleasant and cooperative. Well-nourished and well-developed.  Head:  Normocephalic and atraumatic. Eyes:  Without icterus, sclera clear and conjunctiva pink.  Ears:  Normal auditory acuity. Mouth:  No deformity or lesions, oral mucosa pink.  Lungs:  Clear to auscultation bilaterally. No wheezes, rales, or rhonchi. No distress.  Heart:  S1, S2 present without murmurs appreciated.  Abdomen:  +BS, soft, non-tender and non-distended. No HSM noted. No guarding or rebound. No masses appreciated.  Rectal:  Deferred  Msk:  Symmetrical without gross deformities. Normal posture. Extremities:  Without edema. Neurologic:  Alert and  oriented x4;  grossly normal neurologically. Skin:  Intact without significant lesions or rashes. Psych:  Alert and cooperative. Normal mood and affect.  Assessment   Cynthia Neal is a 41 y.o. female with a history of thymectomy and myasthenia gravis presenting today for evaluation of nausea and weight loss.   Dysphagia, Nausea, Weight Loss: Intermittent daily nausea and liquid regurgitation of food, no overt vomiting.  Has no appetite before she returns up to eat.  Reports about a 60 pound weight loss in the last 5 months.   She reports having long COVID and as a result of COVID she lost her smell and taste and has not gotten back.  For last few months she has had progressive worsening of symptoms including her lack of appetite and getting up out of 2 as well as aversion to different textures.  She does report ability to eat chicken, steak, and hamburgers when unable to eat anything that is very soft/mushy/greasy.  Other than this no specific triggers identified.  Zofran is helping with nausea.  Does have some mild morning emesis that is yellow or clear in nature. Does report a history of colon cancer in her paternal grandfather at age 65. Tried omeprazole for 2 weeks and had difficulty with swallowing capsule given constant globus sensation.  Recent labs in December with stable Hgb and iron panel. TSH wnl. Will check additional labs for evaluation of weight loss including CBC, CMP, CRP, and Hep C ab. Will also get CTA A/P to evaluate for mesenteric ischemia given food aversion and weight loss as well as chest xray. Will perform EGD +/- dilation for evaluation of nausea and dysphagia to assess for gastritis, duodenitis, esophagitis, esophageal web, esophageal strictures/stenosis, esophageal ring, etc. She may continue to use Zofran as needed and recommended a daily protein shake to help with maintaining weight.  Advised to have frequent check on her weight and notify for greater than 5 lb weight loss in 1 week. If initial workup negative will likely need to consider colonoscopy given weight loss and family history of colon cancer.    PLAN   CTA A/P and chest xray CBC, CMP, CRP, Hep C Ab Proceed with upper endoscopy with possible dilation with propofol by Dr. Rourk in near future: the risks, benefits, and alternatives have been discussed with the patient in detail. The patient states understanding and desires to proceed. ASA 2 UPT pre-op Omeprazole 40 mg once daily, may open capsules and swallow contents if unable to swallow  capsule.  Continue Zofran as needed.  Daily protein shake Monitor weights closely at   home and notify of weight loss > 5 lbs in 1 week.  If EGD and other imaging negative will likely need to pursue colonoscopy to assess for malignancy given weight loss.  Follow up in 8 weeks, sooner if needed   Jah Alarid, MSN, FNP-BC, AGACNP-BC [facility] 

## 2022-09-21 NOTE — Progress Notes (Signed)
GI Office Note    Referring Provider: Lanelle Bal, PA-C Primary Care Physician:  Lanelle Bal, PA-C  Primary Gastroenterologist: Cristopher Estimable.Rourk, MD  Chief Complaint   Chief Complaint  Patient presents with   Nausea    Patient here today due to issues with nausea, with occasional vomiting. She has had some unexpected weight loss,gagging, and dysphagia with a feeling of something lodged in her throat. She is not on a ppi, she did try omeprazole,but she could not swallow them as they made her gag. Denies any abdominal pain.    History of Present Illness   Cynthia Neal is a 42 y.o. female presenting today at the request of Lanelle Bal, PA-C for nausea, weight los, dry heaving.   Per review of chart it appears patient underwent laparoscopic appendectomy in 2018.  Review PCP: From visit dated 07/31/2022 patient presented with follow-up of hypothyroidism. Patient also with complaint of dysphagia has gotten worse since she had COVID as foods do not agree with her and she is gagging with certain foods and not sure if this is a texture issue..  For anxiety and depression she was added Abilify to her Lexapro.  Seeing a therapist.  No longer taking Adderall and has not been taking Toprol for palpitations.  Had CTA that was negative for PE for shortness of breath.  Send this is been ongoing and worsening since having COVID in December 2020.  Currently on Symbicort.  Taking iron daily for anemia.  No relief with omeprazole for GERD dysphagia nausea, using Zofran as needed.  Referred to GI.  Patient previously did states she was not taking omeprazole due to being unable to swallow it due to gagging.  It appears it was also present at her visit in December 2023.  Labs December 2023 with albumin 3.4, normal LFTs and renal function.  Iron panel within normal limits.  Hemoglobin 11.5.  TSH 9.6.  Lipid panel within normal limits.  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   Has a feeling of something stuck in her throat after swallowing. Usually only an issue with solids. Has not had any GI evaluation for her swallowing. No recent chest or abdominal imaging.   Denies change in her bowel habits. Alternate between constipation and diarrhea. No melena or brbpr. Rare abdominal. Denies reflux symptoms. Tried omeprazole for about 2 weeks and did not seem to make a difference. Just recently had her synthroid dose decreased.   Paternal grandfather with colon cancer at age 52.   Gagging at the site of food and gagging with it touching her tongue. She is unable to eat things she used to be able to eat. Has lost about 60 lbs since Thanksgiving.   Nausea is on a daily basis. Vomiting occurs usually when food does not seem to agree with her. Does not have an appetite. Has been forcing herself to eat. Her husband fixes her food and brings it to her and she will attempt to eat. Mostly eating bread and meat. Able to eat chicken and steak and hamburger. Any vegetable or weird textures (soft/mushy/greasy) textures are bothersome to her. No specific instance that she can identify as a trigger. Zofran is helping with nausea. More so having regurgitation  after trying to eat and has some mild morning vomiting with emesis that is clear or yellow in color.  Had her gallbladder removed in 2002.   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   LMP 2 weeks prior.    Current Outpatient Medications  Medication Sig Dispense Refill   albuterol (VENTOLIN HFA) 108 (90 Base) MCG/ACT inhaler Inhale 2 puffs into the lungs every 6 (six) hours as needed for wheezing or shortness of breath.     ARIPiprazole (ABILIFY) 5 MG tablet Take 5 mg by mouth at bedtime.     buPROPion ER (WELLBUTRIN SR) 100 MG 12 hr tablet Take 100 mg by mouth 2 (two) times daily.     escitalopram (LEXAPRO) 20  MG tablet Take 20 mg by mouth 2 (two) times daily.     levothyroxine (SYNTHROID) 88 MCG tablet Take 88 mcg by mouth daily.     ondansetron (ZOFRAN) 8 MG tablet Take 8 mg by mouth every 8 (eight) hours as needed for nausea or vomiting.     No current facility-administered medications for this visit.    Past Medical History:  Diagnosis Date   COVID-19 05/2019   Myasthenia ([place])    in remission s/p thymectomy   SOB (shortness of breath)    Vertigo     Past Surgical History:  Procedure Laterality Date   APPENDECTOMY  03/2017   CHOLECYSTECTOMY  2002   THYMECTOMY  2005   TUBAL LIGATION Bilateral 2011    Family History  Problem Relation Age of Onset   Colon cancer Paternal Grandfather    Colon polyps Neg Hx     Allergies as of 09/22/2022   (No Known Allergies)    Social History   Socioeconomic History   Marital status: Married    Spouse name: Not on file   Number of children: Not on file   Years of education: Not on file   Highest education level: Not on file  Occupational History   Not on file  Tobacco Use   Smoking status: Former    Packs/day: 0.50    Years: 20.00    Additional pack years: 0.00    Total pack years: 10.00    Types: Cigarettes    Quit date: 04/08/2020    Years since quitting: 2.4   Smokeless tobacco: Current   Tobacco comments:    Uses a vape daily  Vaping Use   Vaping Use: Every day  Substance and Sexual Activity   Alcohol use: Not Currently   Drug use: Not Currently   Sexual activity: Not on file  Other Topics Concern   Not on file  Social History Narrative   Not on file   Social Determinants of Health   Financial Resource Strain: Not on file  Food Insecurity: Not on file  Transportation Needs: Not on file  Physical Activity: Not on file  Stress: Not on file  Social Connections: Not on file  Intimate Partner Violence: Not on file     Review of Systems   Gen: + weakness. Denies any fever, chills, fatigue, weight loss, lack of  appetite.  CV: Denies chest pain, heart palpitations, peripheral edema, syncope.  Resp: Denies shortness of breath at rest or with exertion. Denies wheezing or cough.  GI: see HPI GU : Denies urinary burning, urinary frequency, urinary hesitancy MS: Denies joint pain, cramps, or limitation of movement.  Derm: Denies rash, itching, dry skin Psych: Denies depression, anxiety, memory loss, and confusion Heme: Denies bruising, bleeding, and enlarged lymph nodes.   Physical Exam   BP  107/76 (BP Location: Left Arm, Patient Position: Sitting, Cuff Size: Large)   Pulse 80   Temp (!) 97.5 F (36.4 C) (Temporal)   Ht 5\' 2"  (1.575 m)   Wt 230 lb 3.2 oz (104.4 kg)   LMP 09/07/2022 (Approximate)   BMI 42.10 kg/m   General:   Alert and oriented. Pleasant and cooperative. Well-nourished and well-developed.  Head:  Normocephalic and atraumatic. Eyes:  Without icterus, sclera clear and conjunctiva pink.  Ears:  Normal auditory acuity. Mouth:  No deformity or lesions, oral mucosa pink.  Lungs:  Clear to auscultation bilaterally. No wheezes, rales, or rhonchi. No distress.  Heart:  S1, S2 present without murmurs appreciated.  Abdomen:  +BS, soft, non-tender and non-distended. No HSM noted. No guarding or rebound. No masses appreciated.  Rectal:  Deferred  Msk:  Symmetrical without gross deformities. Normal posture. Extremities:  Without edema. Neurologic:  Alert and  oriented x4;  grossly normal neurologically. Skin:  Intact without significant lesions or rashes. Psych:  Alert and cooperative. Normal mood and affect.  Assessment   Cynthia Neal is a 42 y.o. female with a history of thymectomy and myasthenia gravis presenting today for evaluation of nausea and weight loss.   Dysphagia, Nausea, Weight Loss: Intermittent daily nausea and liquid regurgitation of food, no overt vomiting.  Has no appetite before she returns up to eat.  Reports about a 60 pound weight loss in the last 5 months.   She reports having long COVID and as a result of COVID she lost her smell and taste and has not gotten back.  For last few months she has had progressive worsening of symptoms including her lack of appetite and getting up out of 2 as well as aversion to different textures.  She does report ability to eat chicken, steak, and hamburgers when unable to eat anything that is very soft/mushy/greasy.  Other than this no specific triggers identified.  Zofran is helping with nausea.  Does have some mild morning emesis that is yellow or clear in nature. Does report a history of colon cancer in her paternal grandfather at age 65. Tried omeprazole for 2 weeks and had difficulty with swallowing capsule given constant globus sensation.  Recent labs in December with stable Hgb and iron panel. TSH wnl. Will check additional labs for evaluation of weight loss including CBC, CMP, CRP, and Hep C ab. Will also get CTA A/P to evaluate for mesenteric ischemia given food aversion and weight loss as well as chest xray. Will perform EGD +/- dilation for evaluation of nausea and dysphagia to assess for gastritis, duodenitis, esophagitis, esophageal web, esophageal strictures/stenosis, esophageal ring, etc. She may continue to use Zofran as needed and recommended a daily protein shake to help with maintaining weight.  Advised to have frequent check on her weight and notify for greater than 5 lb weight loss in 1 week. If initial workup negative will likely need to consider colonoscopy given weight loss and family history of colon cancer.    PLAN   CTA A/P and chest xray CBC, CMP, CRP, Hep C Ab Proceed with upper endoscopy with possible dilation with propofol by Dr. Gala Romney in near future: the risks, benefits, and alternatives have been discussed with the patient in detail. The patient states understanding and desires to proceed. ASA 2 UPT pre-op Omeprazole 40 mg once daily, may open capsules and swallow contents if unable to swallow  capsule.  Continue Zofran as needed.  Daily protein shake Monitor weights closely at  home and notify of weight loss > 5 lbs in 1 week.  If EGD and other imaging negative will likely need to pursue colonoscopy to assess for malignancy given weight loss.  Follow up in 8 weeks, sooner if needed   Venetia Night, MSN, FNP-BC, AGACNP-BC [facility]

## 2022-09-22 ENCOUNTER — Telehealth (INDEPENDENT_AMBULATORY_CARE_PROVIDER_SITE_OTHER): Payer: Self-pay | Admitting: Internal Medicine

## 2022-09-22 ENCOUNTER — Ambulatory Visit (INDEPENDENT_AMBULATORY_CARE_PROVIDER_SITE_OTHER): Payer: 59 | Admitting: Gastroenterology

## 2022-09-22 ENCOUNTER — Encounter: Payer: Self-pay | Admitting: Gastroenterology

## 2022-09-22 VITALS — BP 107/76 | HR 80 | Temp 97.5°F | Ht 62.0 in | Wt 230.2 lb

## 2022-09-22 DIAGNOSIS — R112 Nausea with vomiting, unspecified: Secondary | ICD-10-CM | POA: Diagnosis not present

## 2022-09-22 DIAGNOSIS — R1310 Dysphagia, unspecified: Secondary | ICD-10-CM | POA: Diagnosis not present

## 2022-09-22 DIAGNOSIS — R634 Abnormal weight loss: Secondary | ICD-10-CM

## 2022-09-22 NOTE — Patient Instructions (Addendum)
We will schedule you for an upper endoscopy and chest xray, CT scan of your abdomen to evaluate your weight loss.   You may open the contents of your omeprazole and take daily to see if this improves your nausea.   Take a daily protein shake if able to tolerate. Monitor your weight at home and notify  if more than 5 lb weight loss in 1 week.  We will follow up in 8 weeks, sooner if needed.   It was a pleasure to see you today. I want to create trusting relationships with patients. If you receive a survey regarding your visit,  I greatly appreciate you taking time to fill this out on paper or through your MyChart. I value your feedback.  Venetia Night, MSN, FNP-BC, AGACNP-BC [facility]

## 2022-09-22 NOTE — Telephone Encounter (Signed)
Pt in office earlier and needing EGD +/- ED with Dr.Rourk, CT A/P.   Authorization for CT-Approved [id number] DOS-09/22/22-11/06/22 Scheduled for 10/30/22 @ 3:30pm Arrive 15 min earlier. Pt will NOT have to drink contrast per central scheduling  Authorization for EGD-Approved [id number] DOS 09/22/22-12/21/22   Attempted to contact patient to give her CT appt and schedule her EGD but unable to reach pt. Voicemail full at this time.

## 2022-09-28 ENCOUNTER — Telehealth (INDEPENDENT_AMBULATORY_CARE_PROVIDER_SITE_OTHER): Payer: Self-pay | Admitting: Gastroenterology

## 2022-09-28 ENCOUNTER — Other Ambulatory Visit (INDEPENDENT_AMBULATORY_CARE_PROVIDER_SITE_OTHER): Payer: Self-pay | Admitting: Gastroenterology

## 2022-09-28 DIAGNOSIS — R634 Abnormal weight loss: Secondary | ICD-10-CM

## 2022-09-28 DIAGNOSIS — R112 Nausea with vomiting, unspecified: Secondary | ICD-10-CM

## 2022-09-28 NOTE — Telephone Encounter (Signed)
Cynthia Monday, NP  Vicente Males, LPN Can you please modify patients CT to a CTA abd/pelvis. Can add diagnosis of unintentional weight loss.  Venetia Night, MSN, APRN, FNP-BC, AGACNP-BC [facility] Abdomen Pelvis w/wo contrast ordered and approved via [facility]. Auth number-[id number] Exp date-11/12/2022

## 2022-09-28 NOTE — Telephone Encounter (Signed)
CT Angio scheduled and central scheduling contacted for 10/30/22 at 3:30 arriving 15 min earlier.  Pt contacted and informed of CT appt. Also EGD scheduled for 10/07/22. Pt aware to have UPT done this week and to hold iron x 7 days. EGD instructions sent via my chart

## 2022-09-29 LAB — COMPREHENSIVE METABOLIC PANEL
ALT: 7 IU/L (ref 0–32)
AST: 14 IU/L (ref 0–40)
Albumin/Globulin Ratio: 1.3 (ref 1.2–2.2)
Albumin: 3.5 g/dL — ABNORMAL LOW (ref 3.9–4.9)
Alkaline Phosphatase: 62 IU/L (ref 44–121)
BUN/Creatinine Ratio: 6 — ABNORMAL LOW (ref 9–23)
BUN: 5 mg/dL — ABNORMAL LOW (ref 6–24)
Bilirubin Total: 0.4 mg/dL (ref 0.0–1.2)
CO2: 24 mmol/L (ref 20–29)
Calcium: 8.8 mg/dL (ref 8.7–10.2)
Chloride: 100 mmol/L (ref 96–106)
Creatinine, Ser: 0.77 mg/dL (ref 0.57–1.00)
Globulin, Total: 2.8 g/dL (ref 1.5–4.5)
Glucose: 74 mg/dL (ref 70–99)
Potassium: 3.7 mmol/L (ref 3.5–5.2)
Sodium: 138 mmol/L (ref 134–144)
Total Protein: 6.3 g/dL (ref 6.0–8.5)
eGFR: 99 mL/min/{1.73_m2} (ref >59)

## 2022-09-29 LAB — CBC
Hematocrit: 36.5 % (ref 34.0–46.6)
Hemoglobin: 11.6 g/dL (ref 11.1–15.9)
MCH: 28.2 pg (ref 26.6–33.0)
MCHC: 31.8 g/dL (ref 31.5–35.7)
MCV: 89 fL (ref 79–97)
Platelets: 173 10*3/uL (ref 150–450)
RBC: 4.11 x10E6/uL (ref 3.77–5.28)
RDW: 12.8 % (ref 11.7–15.4)
WBC: 4.9 10*3/uL (ref 3.4–10.8)

## 2022-09-29 LAB — C-REACTIVE PROTEIN: CRP: 4 mg/L (ref 0–10)

## 2022-09-29 LAB — HEPATITIS C ANTIBODY: Hep C Virus Ab: NONREACTIVE

## 2022-09-30 NOTE — Telephone Encounter (Signed)
Attempted to contact pt to make her aware of telephone pre op scheduled for 10/05/22 10:30AM. Pt voicemail is full, unable to leave message

## 2022-10-02 ENCOUNTER — Other Ambulatory Visit: Payer: Self-pay | Admitting: *Deleted

## 2022-10-02 DIAGNOSIS — R634 Abnormal weight loss: Secondary | ICD-10-CM

## 2022-10-02 DIAGNOSIS — R112 Nausea with vomiting, unspecified: Secondary | ICD-10-CM

## 2022-10-02 DIAGNOSIS — R1310 Dysphagia, unspecified: Secondary | ICD-10-CM

## 2022-10-02 NOTE — Telephone Encounter (Signed)
Pt informed of pre-op telephone call on 10/05/22, also to go to [facility] on 10/05/22 to have urine pregnancy test done. Verbalized understanding.

## 2022-10-02 NOTE — Progress Notes (Signed)
error 

## 2022-10-05 ENCOUNTER — Encounter (HOSPITAL_COMMUNITY)
Admission: RE | Admit: 2022-10-05 | Discharge: 2022-10-05 | Disposition: A | Discharge status: Home / Self Care | Payer: 59 | Source: Ambulatory Visit | Attending: Internal Medicine | Admitting: Internal Medicine

## 2022-10-05 ENCOUNTER — Other Ambulatory Visit (HOSPITAL_COMMUNITY)
Admission: RE | Admit: 2022-10-05 | Discharge: 2022-10-05 | Disposition: A | Discharge status: Home / Self Care | Payer: 59 | Source: Ambulatory Visit | Attending: Internal Medicine | Admitting: Internal Medicine

## 2022-10-05 VITALS — Ht 62.0 in | Wt 230.2 lb

## 2022-10-05 DIAGNOSIS — R1310 Dysphagia, unspecified: Secondary | ICD-10-CM | POA: Diagnosis not present

## 2022-10-05 DIAGNOSIS — R112 Nausea with vomiting, unspecified: Secondary | ICD-10-CM | POA: Diagnosis not present

## 2022-10-05 DIAGNOSIS — Z6841 Body Mass Index (BMI) 40.0 and over, adult: Secondary | ICD-10-CM | POA: Diagnosis not present

## 2022-10-05 DIAGNOSIS — Z01812 Encounter for preprocedural laboratory examination: Secondary | ICD-10-CM | POA: Insufficient documentation

## 2022-10-05 DIAGNOSIS — R634 Abnormal weight loss: Secondary | ICD-10-CM | POA: Insufficient documentation

## 2022-10-05 DIAGNOSIS — Z01818 Encounter for other preprocedural examination: Secondary | ICD-10-CM

## 2022-10-05 HISTORY — DX: Unspecified asthma, uncomplicated: J45.909

## 2022-10-05 HISTORY — DX: Hypothyroidism, unspecified: E03.9

## 2022-10-05 LAB — PREGNANCY, URINE: Preg Test, Ur: NEGATIVE

## 2022-10-05 NOTE — Pre-Procedure Instructions (Signed)
Attempted pre-op phone call. VM is full and I could not leae a message.

## 2022-10-06 ENCOUNTER — Encounter (HOSPITAL_COMMUNITY): Payer: Self-pay

## 2022-10-06 ENCOUNTER — Other Ambulatory Visit: Payer: Self-pay

## 2022-10-06 ENCOUNTER — Ambulatory Visit (INDEPENDENT_AMBULATORY_CARE_PROVIDER_SITE_OTHER): Payer: 59 | Admitting: Gastroenterology

## 2022-10-07 ENCOUNTER — Ambulatory Visit (HOSPITAL_COMMUNITY): Payer: 59 | Admitting: Certified Registered"

## 2022-10-07 ENCOUNTER — Encounter (HOSPITAL_COMMUNITY): Payer: Self-pay | Admitting: Internal Medicine

## 2022-10-07 ENCOUNTER — Ambulatory Visit (HOSPITAL_COMMUNITY)
Admission: RE | Admit: 2022-10-07 | Discharge: 2022-10-07 | Disposition: A | Discharge status: Home / Self Care | Payer: 59 | Attending: Internal Medicine | Admitting: Internal Medicine

## 2022-10-07 ENCOUNTER — Ambulatory Visit (HOSPITAL_BASED_OUTPATIENT_CLINIC_OR_DEPARTMENT_OTHER): Payer: 59 | Admitting: Certified Registered"

## 2022-10-07 ENCOUNTER — Encounter (HOSPITAL_COMMUNITY)
Admission: RE | Disposition: A | Discharge status: Home / Self Care | Payer: Self-pay | Source: Home / Self Care | Attending: Internal Medicine

## 2022-10-07 DIAGNOSIS — K2950 Unspecified chronic gastritis without bleeding: Secondary | ICD-10-CM

## 2022-10-07 DIAGNOSIS — R1310 Dysphagia, unspecified: Secondary | ICD-10-CM

## 2022-10-07 DIAGNOSIS — Z9049 Acquired absence of other specified parts of digestive tract: Secondary | ICD-10-CM | POA: Insufficient documentation

## 2022-10-07 DIAGNOSIS — Z800 Family history of malignant neoplasm of digestive organs: Secondary | ICD-10-CM | POA: Insufficient documentation

## 2022-10-07 DIAGNOSIS — D649 Anemia, unspecified: Secondary | ICD-10-CM | POA: Insufficient documentation

## 2022-10-07 DIAGNOSIS — Z87891 Personal history of nicotine dependence: Secondary | ICD-10-CM | POA: Insufficient documentation

## 2022-10-07 DIAGNOSIS — F32A Depression, unspecified: Secondary | ICD-10-CM | POA: Diagnosis not present

## 2022-10-07 DIAGNOSIS — F419 Anxiety disorder, unspecified: Secondary | ICD-10-CM | POA: Diagnosis not present

## 2022-10-07 DIAGNOSIS — R194 Change in bowel habit: Secondary | ICD-10-CM | POA: Insufficient documentation

## 2022-10-07 DIAGNOSIS — Z8616 Personal history of COVID-19: Secondary | ICD-10-CM | POA: Insufficient documentation

## 2022-10-07 DIAGNOSIS — G7000 Myasthenia gravis without (acute) exacerbation: Secondary | ICD-10-CM | POA: Diagnosis not present

## 2022-10-07 DIAGNOSIS — E039 Hypothyroidism, unspecified: Secondary | ICD-10-CM | POA: Insufficient documentation

## 2022-10-07 DIAGNOSIS — Z79899 Other long term (current) drug therapy: Secondary | ICD-10-CM | POA: Insufficient documentation

## 2022-10-07 DIAGNOSIS — R634 Abnormal weight loss: Secondary | ICD-10-CM | POA: Insufficient documentation

## 2022-10-07 HISTORY — PX: ESOPHAGOGASTRODUODENOSCOPY (EGD) WITH PROPOFOL: SHX5813

## 2022-10-07 HISTORY — PX: MALONEY DILATION: SHX5535

## 2022-10-07 HISTORY — PX: BIOPSY: SHX5522

## 2022-10-07 SURGERY — ESOPHAGOGASTRODUODENOSCOPY (EGD) WITH PROPOFOL
Anesthesia: General

## 2022-10-07 MED ORDER — LIDOCAINE HCL (CARDIAC) PF 100 MG/5ML IV SOSY
PREFILLED_SYRINGE | INTRAVENOUS | Status: DC | PRN
  Administered 2022-10-07: 50 mg via INTRAVENOUS

## 2022-10-07 MED ORDER — LACTATED RINGERS IV SOLN: INTRAVENOUS | Status: DC

## 2022-10-07 MED ORDER — DEXMEDETOMIDINE HCL IN NACL 80 MCG/20ML IV SOLN
INTRAVENOUS | Status: DC | PRN
  Administered 2022-10-07: 8 ug via BUCCAL
  Administered 2022-10-07: 12 ug via BUCCAL

## 2022-10-07 MED ORDER — PROPOFOL 10 MG/ML IV BOLUS
INTRAVENOUS | Status: DC | PRN
  Administered 2022-10-07: 100 mg via INTRAVENOUS
  Administered 2022-10-07 (×3): 50 mg via INTRAVENOUS

## 2022-10-07 MED ORDER — PROPOFOL 500 MG/50ML IV EMUL
INTRAVENOUS | Status: DC | PRN
  Administered 2022-10-07: 150 ug/kg/min via INTRAVENOUS

## 2022-10-07 NOTE — Anesthesia Procedure Notes (Signed)
Date/Time: 10/07/2022 1:10 PM  Performed by: Julian Reil, CRNAPre-anesthesia Checklist: Patient identified, Emergency Drugs available, Suction available and Patient being monitored Patient Re-evaluated:Patient Re-evaluated prior to induction Oxygen Delivery Method: Nasal cannula Induction Type: IV induction Placement Confirmation: positive ETCO2

## 2022-10-07 NOTE — Discharge Instructions (Addendum)
EGD Discharge instructions Please read the instructions outlined below and refer to this sheet in the next few weeks. These discharge instructions provide you with general information on caring for yourself after you leave the hospital. Your doctor may also give you specific instructions. While your treatment has been planned according to the most current medical practices available, unavoidable complications occasionally occur. If you have any problems or questions after discharge, please call your doctor. ACTIVITY You may resume your regular activity but move at a slower pace for the next 24 hours.  Take frequent rest periods for the next 24 hours.  Walking will help expel (get rid of) the air and reduce the bloated feeling in your abdomen.  No driving for 24 hours (because of the anesthesia (medicine) used during the test).  You may shower.  Do not sign any important legal documents or operate any machinery for 24 hours (because of the anesthesia used during the test).  NUTRITION Drink plenty of fluids.  You may resume your normal diet.  Begin with a light meal and progress to your normal diet.  Avoid alcoholic beverages for 24 hours or as instructed by your caregiver.  MEDICATIONS You may resume your normal medications unless your caregiver tells you otherwise.  WHAT YOU CAN EXPECT TODAY You may experience abdominal discomfort such as a feeling of fullness or "gas" pains.  FOLLOW-UP Your doctor will discuss the results of your test with you.  SEEK IMMEDIATE MEDICAL ATTENTION IF ANY OF THE FOLLOWING OCCUR: Excessive nausea (feeling sick to your stomach) and/or vomiting.  Severe abdominal pain and distention (swelling).  Trouble swallowing.  Temperature over 101 F (37.8 C).  Rectal bleeding or vomiting of blood.     Your esophagus appeared normal.  It was stretched  Your esophagus was biopsied.  Your stomach was inflamed.  Biopsies taken.  Continue omeprazole 40 mg daily May  dissolve contents in 4 ounces of water.  Take every day 30 minutes before breakfast  Further recommendations to follow pending review of pathology report  Office visit with Brooke Bonito in 4 weeks  At patient request, I called Christiane Ha at [phone] -reviewed findings and recommendations

## 2022-10-07 NOTE — Op Note (Signed)
[facility] Patient Name: Cynthia Neal Procedure Date: 10/07/2022 12:44 PM MRN: [id number] Date of Birth: May 29, 1981 Attending MD: Gennette Pac , MD, [id number] CSN: [id number] Age: 42 Admit Type: Outpatient Procedure:                Upper GI endoscopy Indications:              Dysphagia Providers:                Gennette Pac, MD, Angelica Ran, Pandora Leiter, Technician Referring MD:              Medicines:                Propofol per Anesthesia Complications:            No immediate complications. Estimated Blood Loss:     Estimated blood loss was minimal. Procedure:                Pre-Anesthesia Assessment:                           - Prior to the procedure, a History and Physical                            was performed, and patient medications and                            allergies were reviewed. The patient's tolerance of                            previous anesthesia was also reviewed. The risks                            and benefits of the procedure and the sedation                            options and risks were discussed with the patient.                            All questions were answered, and informed consent                            was obtained. Prior Anticoagulants: The patient has                            taken no anticoagulant or antiplatelet agents. ASA                            Grade Assessment: III - A patient with severe                            systemic disease. After reviewing the risks and  benefits, the patient was deemed in satisfactory                            condition to undergo the procedure.                           After obtaining informed consent, the endoscope was                            passed under direct vision. Throughout the                            procedure, the patient's blood pressure, pulse, and                            oxygen saturations  were monitored continuously. The                            [id number]-[id number] ([id number]) scope was introduced through the                            mouth, and advanced to the second part of duodenum.                            The upper GI endoscopy was accomplished without                            difficulty. The patient tolerated the procedure                            well. Scope In: 1:09:23 PM Scope Out: 1:19:42 PM Total Procedure Duration: 0 hours 10 minutes 19 seconds  Findings:      The examined esophagus was normal. Gastric cavity empty. Patient had       prominent diffuse gastric erosions patchy erythema. No ulcer or       infiltrating process. Pylorus patent.      The duodenal bulb and second portion of the duodenum were normal. The       scope was withdrawn. Dilation was performed with a Maloney dilator with       mild resistance at 52 Fr. The dilation site was examined following       endoscope reinsertion and showed no change. Estimated blood loss: none.       Subsequently, biopsies of gastric antrum and body were biopsied for       histologic study. Finally, biopsies of mid distal esophagus were taken. Impression:               - Normal esophagus. Dilated. And biopsied. Abnormal                            gastric mucosa of uncertain significance status                            post biopsy                           -  Normal duodenal bulb and second portion of the                            duodenum.                           - Moderate Sedation:      Moderate (conscious) sedation was personally administered by an       anesthesia professional. The following parameters were monitored: oxygen       saturation, heart rate, blood pressure, respiratory rate, EKG, adequacy       of pulmonary ventilation, and response to care. Recommendation:           - Patient has a contact number available for                            emergencies. The signs and symptoms of potential                             delayed complications were discussed with the                            patient. Return to normal activities tomorrow.                            Written discharge instructions were provided to the                            patient.                           - Advance diet as tolerated. Continue                            omeprazole?"make take contents of capsule and                            dissolve in 4 ounces of water to administer.                            Follow-up on pathology. Office visit with [place] in 4                            weeks Procedure Code(s):        --- Professional ---                           [phone], Esophagogastroduodenoscopy, flexible,                            transoral; diagnostic, including collection of                            specimen(s) by brushing or washing, when performed                            (  separate procedure)                           43450, Dilation of esophagus, by unguided sound or                            bougie, single or multiple passes Diagnosis Code(s):        --- Professional ---                           R13.10, Dysphagia, unspecified CPT copyright 2022 American Medical Association. All rights reserved. The codes documented in this report are preliminary and upon coder review may  be revised to meet current compliance requirements. Gerrit Friends. Raeleen Winstanley, MD Gennette Pac, MD 10/07/2022 1:25:33 PM This report has been signed electronically. Number of Addenda: 0

## 2022-10-07 NOTE — Interval H&P Note (Signed)
History and Physical Interval Note:  10/07/2022 12:52 PM  Cynthia Neal  has presented today for surgery, with the diagnosis of DYSPHAGIA, NAUSEA/VOMITING.  The various methods of treatment have been discussed with the patient and family. After consideration of risks, benefits and other options for treatment, the patient has consented to  Procedure(s) with comments: ESOPHAGOGASTRODUODENOSCOPY (EGD) WITH PROPOFOL (N/A) - 1:30 PM; ASA 2 MALONEY DILATION (N/A) - 1:30PM;ASA 2 as a surgical intervention.  The patient's history has been reviewed, patient examined, no change in status, stable for surgery.  I have reviewed the patient's chart and labs.  Questions were answered to the patient's satisfaction.     Cynthia Neal   no change.  Has not been able to take omeprazole because she attempts to swallow the pill whole and gets nauseated.  Esophageal dysphagia also as described previously.  Discussed EGD with possible esophageal dilation is feasible/appropriate etc. per plan with   Patient and spouse.  The risks, benefits, limitations, alternatives and imponderables have been reviewed with the patient. Potential for esophageal dilation, biopsy, etc. have also been reviewed.  Questions have been answered. All parties agreeable.

## 2022-10-07 NOTE — Anesthesia Preprocedure Evaluation (Signed)
Anesthesia Evaluation  Patient identified by MRN, date of birth, ID band Patient awake    Reviewed: Allergy & Precautions, H&P , NPO status , Patient's Chart, lab work & pertinent test results, reviewed documented beta blocker date and time   Airway Mallampati: II  TM Distance: >3 FB Neck ROM: full    Dental no notable dental hx.    Pulmonary neg pulmonary ROS, asthma , former smoker   Pulmonary exam normal breath sounds clear to auscultation       Cardiovascular Exercise Tolerance: Good negative cardio ROS  Rhythm:regular Rate:Normal     Neuro/Psych  Neuromuscular disease negative neurological ROS  negative psych ROS   GI/Hepatic negative GI ROS, Neg liver ROS,,,  Endo/Other  negative endocrine ROSHypothyroidism    Renal/GU negative Renal ROS  negative genitourinary   Musculoskeletal negative musculoskeletal ROS (+)    Abdominal   Peds negative pediatric ROS (+)  Hematology negative hematology ROS (+)   Anesthesia Other Findings   Reproductive/Obstetrics negative OB ROS                             Anesthesia Physical Anesthesia Plan  ASA: 2  Anesthesia Plan: General   Post-op Pain Management:    Induction:   PONV Risk Score and Plan: Propofol infusion  Airway Management Planned:   Additional Equipment:   Intra-op Plan:   Post-operative Plan:   Informed Consent: I have reviewed the patients History and Physical, chart, labs and discussed the procedure including the risks, benefits and alternatives for the proposed anesthesia with the patient or authorized representative who has indicated his/her understanding and acceptance.     Dental Advisory Given  Plan Discussed with: CRNA  Anesthesia Plan Comments:        Anesthesia Quick Evaluation

## 2022-10-07 NOTE — Transfer of Care (Signed)
Immediate Anesthesia Transfer of Care Note  Patient: Cynthia Neal  Procedure(s) Performed: ESOPHAGOGASTRODUODENOSCOPY (EGD) WITH PROPOFOL MALONEY DILATION BIOPSY  Patient Location: Short Stay  Anesthesia Type:General  Level of Consciousness: drowsy  Airway & Oxygen Therapy: Patient Spontanous Breathing  Post-op Assessment: Report given to RN and Post -op Vital signs reviewed and stable  Post vital signs: Reviewed and stable  Last Vitals:  Vitals Value Taken Time  BP    Temp    Pulse    Resp    SpO2      Last Pain:  Vitals:   10/07/22 1302  TempSrc:   PainSc: 0-No pain      Patients Stated Pain Goal: 6 (10/07/22 1231)  Complications: No notable events documented.

## 2022-10-08 LAB — SURGICAL PATHOLOGY

## 2022-10-09 NOTE — Anesthesia Postprocedure Evaluation (Signed)
Anesthesia Post Note  Patient: Cynthia Neal  Procedure(s) Performed: ESOPHAGOGASTRODUODENOSCOPY (EGD) WITH PROPOFOL MALONEY DILATION BIOPSY  Patient location during evaluation: Phase II Anesthesia Type: General Level of consciousness: awake Pain management: pain level controlled Vital Signs Assessment: post-procedure vital signs reviewed and stable Respiratory status: spontaneous breathing and respiratory function stable Cardiovascular status: blood pressure returned to baseline and stable Postop Assessment: no headache and no apparent nausea or vomiting Anesthetic complications: no Comments: Late entry   No notable events documented.   Last Vitals:  Vitals:   10/07/22 1330 10/07/22 1334  BP: 90/66 (!) 89/72  Pulse:    Resp:    Temp:    SpO2: 92% 98%    Last Pain:  Vitals:   10/07/22 1334  TempSrc:   PainSc: 0-No pain                 Windell Norfolk

## 2022-10-14 ENCOUNTER — Encounter (HOSPITAL_COMMUNITY): Payer: Self-pay | Admitting: Internal Medicine

## 2022-10-23 ENCOUNTER — Encounter: Payer: Self-pay | Admitting: Internal Medicine

## 2022-10-30 ENCOUNTER — Ambulatory Visit (HOSPITAL_COMMUNITY)
Admission: RE | Admit: 2022-10-30 | Discharge: 2022-10-30 | Disposition: A | Discharge status: Home / Self Care | Payer: 59 | Source: Ambulatory Visit | Attending: Gastroenterology | Admitting: Gastroenterology

## 2022-10-30 ENCOUNTER — Other Ambulatory Visit (HOSPITAL_COMMUNITY): Payer: 59

## 2022-10-30 DIAGNOSIS — R634 Abnormal weight loss: Secondary | ICD-10-CM | POA: Diagnosis present

## 2022-10-30 DIAGNOSIS — R112 Nausea with vomiting, unspecified: Secondary | ICD-10-CM | POA: Diagnosis present

## 2022-10-30 MED ORDER — IOHEXOL 350 MG/ML SOLN
100.0000 mL | Freq: Once | INTRAVENOUS | Status: AC | PRN
  Administered 2022-10-30: 100 mL via INTRAVENOUS

## 2022-11-12 ENCOUNTER — Ambulatory Visit (HOSPITAL_COMMUNITY): Payer: 59

## 2022-11-17 ENCOUNTER — Ambulatory Visit (INDEPENDENT_AMBULATORY_CARE_PROVIDER_SITE_OTHER): Payer: 59 | Admitting: Gastroenterology

## 2022-11-17 ENCOUNTER — Encounter: Payer: Self-pay | Admitting: Gastroenterology

## 2022-11-17 VITALS — BP 113/74 | HR 73 | Temp 97.5°F | Ht 62.0 in | Wt 224.8 lb

## 2022-11-17 DIAGNOSIS — R634 Abnormal weight loss: Secondary | ICD-10-CM | POA: Diagnosis not present

## 2022-11-17 DIAGNOSIS — R1310 Dysphagia, unspecified: Secondary | ICD-10-CM

## 2022-11-17 DIAGNOSIS — R112 Nausea with vomiting, unspecified: Secondary | ICD-10-CM | POA: Diagnosis not present

## 2022-11-17 NOTE — Patient Instructions (Addendum)
Please pick up zinc from your local pharmacy.  I want you to start taking 75 mg daily.  Continue Zofran as needed for nausea.  Continue drinking her Ensure.  I want you to increase this to 2/day to ensure you are getting the necessary amount of protein to help prevent further weight loss.  We will schedule you for a colonoscopy in the near future.  Will get you a lower volume prep.  Before you start drinking your prep and when she did take a dose of Zofran to ensure you are able to complete the prep.  We will follow-up in about 8 weeks.  As we discussed I will discuss all this further with Dr. Jena Gauss for any further recommendations.  It was a pleasure to see you today. I want to create trusting relationships with patients. If you receive a survey regarding your visit,  I greatly appreciate you taking time to fill this out on paper or through your MyChart. I value your feedback.  Brooke Bonito, MSN, FNP-BC, AGACNP-BC [facility]

## 2022-11-17 NOTE — Progress Notes (Signed)
GI Office Note    Referring Provider: Lianne Moris, PA-C Primary Gastroenterologist: Gerrit Friends.Rourk, MD  Date:  11/17/2022  ID:  Cynthia Neal, DOB 05-22-1981, MRN [id number]   Chief Complaint   Chief Complaint  Patient presents with   Follow-up    Patient here today for a follow up appointment. Patient says she is still having nausea and decreased appetite, and weight loss. She is taking ondansetron 8 mg prn n&v.    History of Present Illness  Cynthia Neal is a 42 y.o. female with a history of asthma, COVID, hypothyroidism, myasthenia gravis s/p thymectomy, and appendectomy 2018 presenting today for follow-up of ongoing weight loss, nausea, lack of appetite.   Review PCP: From visit dated 07/31/2022 patient presented with follow-up of hypothyroidism. Patient also with complaint of dysphagia has gotten worse since she had COVID as foods do not agree with her and she is gagging with certain foods and not sure if this is a texture issue..  For anxiety and depression she was added Abilify to her Lexapro.  Seeing a therapist.  No longer taking Adderall and has not been taking Toprol for palpitations.  Had CTA that was negative for PE for shortness of breath.  Send this is been ongoing and worsening since having COVID in December 2020.  Currently on Symbicort.  Taking iron daily for anemia.  No relief with omeprazole for GERD dysphagia nausea, using Zofran as needed.  Referred to GI.  Patient previously did states she was not taking omeprazole due to being unable to swallow it due to gagging.  It appears it was also present at her visit in December 2023.   Labs December 2023 with albumin 3.4, normal LFTs and renal function.  Iron panel within normal limits.  Hemoglobin 11.5.  TSH 9.6.  Lipid panel within normal limits.  Initial office visit 09/22/2022. Has been dealing with symptoms of long COVID and has not had return of her smell or taste.  Has a constant feeling of something stuck in her  throat after swallowing and usually only occurs with solids.  No recent changes in bowel habits, alternates between constipation and diarrhea.  Denied any reflux episodes and had tried omeprazole for 2 weeks but did not seem to make a difference with her ability to eat.  She has had a lack of appetite and gagging at the site of food is into the tissues her tongue.  She has been eating very small amounts per day and this has been going on since Thanksgiving.  She reports about a 60 pound weight loss.  Also having nausea on a daily basis and at times mild vomiting.  Has to force herself to eat.  Interestingly she is able to eat chicken, steak, hamburger without any issue however other textures such as vegetables or anything soft/mushy/greasy makes her regurgitate.Will check CTA A/P and chest x-ray, CBC, CMP, CRP, hep C antibody.  Scheduled for EGD/ED/TCS.  Take omeprazole once daily, may open capsule and swallowed contents.  Advised daily protein shake monitor weight closely at home.  Denies of EGD negative and no improvement in weight will likely need to pursue colonoscopy.  Labs 09/28/2022: Normal CBC, CRP.  Negative for hep C.  CMP with BUN 5, albumin 3.5, otherwise normal.  EGD 10/07/22: -Normal esophagus s/p dilation and biopsy -Abnormal gastric mucosa (diffuse erosion and patchy erythema) s/p biopsy -Normal duodenum -Advance advance diet as tolerated and continue omeprazole (may dissolve contents in water) -Gastric biopsies with focal chronic inactive  gastritis negative for H. Pylori -Esophageal biopsies without significant alteration  CTA A/P 10/30/2022: -No evidence of significant atherosclerotic plaque, stenosis, or occlusion to suggest mesenteric ischemia -No acute abnormality within the abdomen or pelvis -Surgical changes of prior cholecystectomy and appendectomy.  Today: Dysphagia: Improved significant since the EGD.   Nausea: Tacking zofran 8 mg daily. No real change. Has not tried much new   foods. The thought of food she wants to gag. Drinking a protein shake daily and probably only eating about a handful and that is usually a meat or rice with cheese.  The things that she does like to eat are causing her to regurgitate.   Not having a ton of abdominal pain but is random here and there.   Weight loss: Zero appetite at all. She is given food to try to eat. Able to get down the protein shakes okay. Since COVID lots of foods do not taste right to her either. Ensure MAX is the best brand for her.   Taking Zofran here or there. It does take the edge off a little but is not significant.   No recent changes since last visit to synthroid.   Wt Readings from Last 3 Encounters:  11/17/22 224 lb 12.8 oz (102 kg)  10/06/22 230 lb 3.2 oz (104.4 kg)  09/22/22 230 lb 3.2 oz (104.4 kg)  04/18/21 283 lb   Current Outpatient Medications  Medication Sig Dispense Refill   albuterol (VENTOLIN HFA) 108 (90 Base) MCG/ACT inhaler Inhale 2 puffs into the lungs every 6 (six) hours as needed for wheezing or shortness of breath.     ARIPiprazole (ABILIFY) 5 MG tablet Take 5 mg by mouth at bedtime.     escitalopram (LEXAPRO) 20 MG tablet Take 40 mg by mouth daily.     levothyroxine (SYNTHROID) 88 MCG tablet Take 88 mcg by mouth daily.     ondansetron (ZOFRAN) 8 MG tablet Take 8 mg by mouth every 8 (eight) hours as needed for nausea or vomiting.     No current facility-administered medications for this visit.    Past Medical History:  Diagnosis Date   Asthma    COVID-19 05/2019   Hypothyroidism    Myasthenia (HCC)    in remission s/p thymectomy   SOB (shortness of breath)    Vertigo     Past Surgical History:  Procedure Laterality Date   APPENDECTOMY  03/2017   BIOPSY  10/07/2022   Procedure: BIOPSY;  Surgeon: Corbin Ade, MD;  [facility];  Service: Endoscopy;;   CHOLECYSTECTOMY  2002   ESOPHAGOGASTRODUODENOSCOPY (EGD) WITH PROPOFOL N/A 10/07/2022   Procedure:  ESOPHAGOGASTRODUODENOSCOPY (EGD) WITH PROPOFOL;  Surgeon: Corbin Ade, MD;  Location: [facility];  Service: Endoscopy;  Laterality: N/A;  1:30 PM; ASA 2   MALONEY DILATION N/A 10/07/2022   Procedure: MALONEY DILATION;  Surgeon: Corbin Ade, MD;  Location: [facility];  Service: Endoscopy;  Laterality: N/A;  1:30PM;ASA 2   THYMECTOMY  2005   TUBAL LIGATION Bilateral 2011    Family History  Problem Relation Age of Onset   Colon cancer Paternal Grandfather    Colon polyps Neg Hx     Allergies as of 11/17/2022   (No Known Allergies)    Social History   Socioeconomic History   Marital status: Married    Spouse name: Not on file   Number of children: Not on file   Years of education: Not on file   Highest  education level: Not on file  Occupational History   Not on file  Tobacco Use   Smoking status: Former    Packs/day: 0.50    Years: 20.00    Additional pack years: 0.00    Total pack years: 10.00    Types: Cigarettes    Quit date: 04/08/2020    Years since quitting: 2.6   Smokeless tobacco: Current   Tobacco comments:    Uses a vape daily  Vaping Use   Vaping Use: Every day  Substance and Sexual Activity   Alcohol use: Not Currently   Drug use: Not Currently   Sexual activity: Yes  Other Topics Concern   Not on file  Social History Narrative   Not on file   Social Determinants of Health   Financial Resource Strain: Not on file  Food Insecurity: Not on file  Transportation Needs: Not on file  Physical Activity: Not on file  Stress: Not on file  Social Connections: Not on file     Review of Systems   Gen: Denies fever, chills, anorexia. Denies fatigue, weakness, weight loss.  CV: Denies chest pain, palpitations, syncope, peripheral edema, and claudication. Resp: Denies dyspnea at rest, cough, wheezing, coughing up blood, and pleurisy. GI: See HPI Derm: Denies rash, itching, dry skin Psych: Denies depression, anxiety, memory loss, confusion. No  homicidal or suicidal ideation.  Heme: Denies bruising, bleeding, and enlarged lymph nodes.   Physical Exam   BP 113/74 (BP Location: Left Arm, Patient Position: Sitting, Cuff Size: Large)   Pulse 73   Temp (!) 97.5 F (36.4 C) (Temporal)   Ht 5\' 2"  (1.575 m)   Wt 224 lb 12.8 oz (102 kg)   BMI 41.12 kg/m   General:   Alert and oriented. No distress noted. Pleasant and cooperative.  Head:  Normocephalic and atraumatic. Eyes:  Conjuctiva clear without scleral icterus. Mouth:  Oral mucosa pink and moist. Good dentition. No lesions. Lungs:  Clear to auscultation bilaterally. No wheezes, rales, or rhonchi. No distress.  Heart:  S1, S2 present without murmurs appreciated.  Abdomen:  +BS, soft, non-distended. Mild ttp to epigastrium. No rebound or guarding. No HSM or masses noted. Rectal: deferred Msk:  Symmetrical without gross deformities. Normal posture. Extremities:  Without edema. Neurologic:  Alert and  oriented x4 Psych:  Alert and cooperative. Normal mood and affect.   Assessment  Cynthia Neal is a 42 y.o. female with a history of asthma, COVID 19, myasthenia gravis s/p thymectomy, and appendectomy in 2018 presenting today for follow-up of weight loss and nausea  Nausea, lack of appetite, weight loss: Having daily intermittent nausea. Using zofran as needed but does not help significantly. The thought of food makes her feel like she is going to gag and has zero appetite. She has continued to lose weight. Has lost about 60 lbs since Thanksgiving. Drinking one Ensure daily. Suspect this is largely related to her lack of taste for foods since having COVID. She is only able to tolerate meat and very few other foods. Continues to have regurgitation of food as well. Has rare abdominal pain. Recent EGD with diffuse erosions in stomach and underwent esophageal dilation. Other workup thus far with stable thyroid levels and has had no recent change to her dosage. CTA negative. Will perform  colonoscopy to rule out malignancy although less likely. Will review medications to assess for potential cause of lack of appetite. Will trial zinc supplement to help with lack of taste. May consider appetite stimulant -  will discuss further treatment options and evaluation with Dr. Jena Gauss.    Dysphagia: Significantly improved with recent dilation.   PLAN   Proceed with colonoscopy with propofol by Dr. Jena Gauss in near future: the risks, benefits, and alternatives have been discussed with the patient in detail. The patient states understanding and desires to proceed. ASA 2 UPT preop Consider testing for adrenal insufficiency Zinc 75 mg daily.  Continue Ensure, increase to 2 per day if not getting other adequate protein intake from solids.  Continue omeprazole, can open capsule to swallow contents if needed.   Will potentially treat with appetite supplement.  Will follow up in 6-8 weeks Will discuss further workup with Dr. Jena Gauss.    Discussed with Dr. Jena Gauss. Will assess medications for potential cause. Will trial Megace or dronabinol for appetite stimulation.   Brooke Bonito, MSN, FNP-BC, AGACNP-BC [facility]

## 2022-11-19 ENCOUNTER — Other Ambulatory Visit: Payer: Self-pay | Admitting: Gastroenterology

## 2022-11-27 ENCOUNTER — Encounter: Payer: Self-pay | Admitting: *Deleted

## 2022-11-27 ENCOUNTER — Telehealth: Payer: Self-pay | Admitting: Gastroenterology

## 2022-11-27 ENCOUNTER — Telehealth: Payer: Self-pay | Admitting: *Deleted

## 2022-11-27 DIAGNOSIS — R634 Abnormal weight loss: Secondary | ICD-10-CM

## 2022-11-27 DIAGNOSIS — R112 Nausea with vomiting, unspecified: Secondary | ICD-10-CM

## 2022-11-27 DIAGNOSIS — R630 Anorexia: Secondary | ICD-10-CM

## 2022-11-27 MED ORDER — CLENPIQ 10-3.5-12 MG-GM -GM/175ML PO SOLN: 1.0000 | ORAL | 0 refills | Status: DC

## 2022-11-27 MED ORDER — DRONABINOL 2.5 MG PO CAPS: 2.5000 mg | ORAL_CAPSULE | Freq: Two times a day (BID) | ORAL | 0 refills | Status: AC

## 2022-11-27 NOTE — Telephone Encounter (Signed)
Sent pt a MyChart message

## 2022-11-27 NOTE — Telephone Encounter (Signed)
Spoke with pt. Scheduled TCS with Dr. Jena Gauss 7/1. Aware will need UPT prior. Will send rx for prep to pharmacy. Requested low volume since has issues with nausea. Will send this in also.    PA approved via [facility] CPT Code GECOL Description: Colonoscopy Authorization Number: [id number] Case Number: [id number] Review Date: 11/27/2022 10:53:49 AM Expiration Date: 02/25/2023 Status: Your case has been Approved.

## 2022-11-27 NOTE — Telephone Encounter (Signed)
Please let patient know I spoke with Dr. Jena Gauss and he agrees with colonoscopy and that we should try an appetite stimulant.  I am going to send in dronabinol which she will take 2.5 mg twice a day about an hour prior to eating.  This appears to be the best appetite stimulant for her given Remeron is contraindicated with Lexapro and Abilify.   She should monitor for changes in mood, confusion, loss of memory, hallucinations, dizziness, significant lightheadedness.  If she experiences any of these or any other side effects she should let me know and stop the medication immediately.  It was a pleasure to see you today. I want to create trusting relationships with patients. If you receive a survey regarding your visit,  I greatly appreciate you taking time to fill this out on paper or through your MyChart. I value your feedback.  Brooke Bonito, MSN, FNP-BC, AGACNP-BC [facility]

## 2022-12-24 ENCOUNTER — Other Ambulatory Visit (HOSPITAL_COMMUNITY)
Admission: RE | Admit: 2022-12-24 | Discharge: 2022-12-24 | Disposition: A | Discharge status: Home / Self Care | Payer: 59 | Source: Ambulatory Visit | Attending: Internal Medicine | Admitting: Internal Medicine

## 2022-12-24 DIAGNOSIS — R630 Anorexia: Secondary | ICD-10-CM | POA: Insufficient documentation

## 2022-12-24 DIAGNOSIS — R634 Abnormal weight loss: Secondary | ICD-10-CM | POA: Insufficient documentation

## 2022-12-24 DIAGNOSIS — R112 Nausea with vomiting, unspecified: Secondary | ICD-10-CM | POA: Insufficient documentation

## 2022-12-24 LAB — PREGNANCY, URINE: Preg Test, Ur: NEGATIVE

## 2022-12-28 ENCOUNTER — Encounter (HOSPITAL_COMMUNITY): Payer: Self-pay | Admitting: Internal Medicine

## 2022-12-28 ENCOUNTER — Other Ambulatory Visit: Payer: Self-pay

## 2022-12-28 ENCOUNTER — Ambulatory Visit (HOSPITAL_BASED_OUTPATIENT_CLINIC_OR_DEPARTMENT_OTHER): Payer: 59 | Admitting: Certified Registered"

## 2022-12-28 ENCOUNTER — Encounter (HOSPITAL_COMMUNITY)
Admission: RE | Disposition: A | Discharge status: Home / Self Care | Payer: Self-pay | Source: Home / Self Care | Attending: Internal Medicine

## 2022-12-28 ENCOUNTER — Ambulatory Visit (HOSPITAL_COMMUNITY)
Admission: RE | Admit: 2022-12-28 | Discharge: 2022-12-28 | Disposition: A | Discharge status: Home / Self Care | Payer: 59 | Attending: Internal Medicine | Admitting: Internal Medicine

## 2022-12-28 ENCOUNTER — Ambulatory Visit (HOSPITAL_COMMUNITY): Payer: 59 | Admitting: Certified Registered"

## 2022-12-28 DIAGNOSIS — Z800 Family history of malignant neoplasm of digestive organs: Secondary | ICD-10-CM | POA: Insufficient documentation

## 2022-12-28 DIAGNOSIS — R634 Abnormal weight loss: Secondary | ICD-10-CM

## 2022-12-28 DIAGNOSIS — Z79890 Hormone replacement therapy (postmenopausal): Secondary | ICD-10-CM | POA: Diagnosis not present

## 2022-12-28 DIAGNOSIS — E039 Hypothyroidism, unspecified: Secondary | ICD-10-CM | POA: Insufficient documentation

## 2022-12-28 DIAGNOSIS — Z79899 Other long term (current) drug therapy: Secondary | ICD-10-CM | POA: Diagnosis not present

## 2022-12-28 DIAGNOSIS — Z1211 Encounter for screening for malignant neoplasm of colon: Secondary | ICD-10-CM | POA: Diagnosis not present

## 2022-12-28 DIAGNOSIS — J45909 Unspecified asthma, uncomplicated: Secondary | ICD-10-CM | POA: Insufficient documentation

## 2022-12-28 DIAGNOSIS — Z87891 Personal history of nicotine dependence: Secondary | ICD-10-CM | POA: Diagnosis not present

## 2022-12-28 HISTORY — PX: COLONOSCOPY WITH PROPOFOL: SHX5780

## 2022-12-28 SURGERY — COLONOSCOPY WITH PROPOFOL
Anesthesia: General

## 2022-12-28 MED ORDER — LACTATED RINGERS IV SOLN: INTRAVENOUS | Status: DC

## 2022-12-28 MED ORDER — LIDOCAINE HCL (CARDIAC) PF 100 MG/5ML IV SOSY
PREFILLED_SYRINGE | INTRAVENOUS | Status: DC | PRN
  Administered 2022-12-28: 50 mg via INTRAVENOUS

## 2022-12-28 MED ORDER — PROPOFOL 10 MG/ML IV BOLUS
INTRAVENOUS | Status: DC | PRN
  Administered 2022-12-28: 100 mg via INTRAVENOUS
  Administered 2022-12-28: 40 mg via INTRAVENOUS

## 2022-12-28 MED ORDER — DEXMEDETOMIDINE HCL IN NACL 80 MCG/20ML IV SOLN
INTRAVENOUS | Status: DC | PRN
  Administered 2022-12-28: 8 ug via INTRAVENOUS
  Administered 2022-12-28: 4 ug via INTRAVENOUS

## 2022-12-28 MED ORDER — PROPOFOL 500 MG/50ML IV EMUL
INTRAVENOUS | Status: DC | PRN
  Administered 2022-12-28: 150 ug/kg/min via INTRAVENOUS

## 2022-12-28 NOTE — H&P (Signed)
@LOGO @   Primary Care Physician:  Lianne Moris, PA-C Primary Gastroenterologist:  Dr. Jena Gauss  Pre-Procedure History & Physical: HPI:  Cynthia Neal is a 42 y.o. female here for further evaluation of profound weight loss after extensive evaluation via colonoscopy today.  Secondary relatives with colon cancer at a young age.  Denies diarrhea.  Past Medical History:  Diagnosis Date   Asthma    COVID-19 05/2019   Hypothyroidism    Myasthenia (HCC)    in remission s/p thymectomy   SOB (shortness of breath)    Vertigo     Past Surgical History:  Procedure Laterality Date   APPENDECTOMY  03/2017   BIOPSY  10/07/2022   Procedure: BIOPSY;  Surgeon: Corbin Ade, MD;  [facility];  Service: Endoscopy;;   CHOLECYSTECTOMY  2002   ESOPHAGOGASTRODUODENOSCOPY (EGD) WITH PROPOFOL N/A 10/07/2022   Procedure: ESOPHAGOGASTRODUODENOSCOPY (EGD) WITH PROPOFOL;  Surgeon: Corbin Ade, MD;  [facility];  Service: Endoscopy;  Laterality: N/A;  1:30 PM; ASA 2   MALONEY DILATION N/A 10/07/2022   Procedure: MALONEY DILATION;  Surgeon: Corbin Ade, MD;  [facility];  Service: Endoscopy;  Laterality: N/A;  1:30PM;ASA 2   THYMECTOMY  2005   TUBAL LIGATION Bilateral 2011    Prior to Admission medications   Medication Sig Start Date End Date Taking? Authorizing Provider  albuterol (VENTOLIN HFA) 108 (90 Base) MCG/ACT inhaler Inhale 2 puffs into the lungs every 6 (six) hours as needed for wheezing or shortness of breath.   Yes [provider]  ARIPiprazole (ABILIFY) 5 MG tablet Take 5 mg by mouth at bedtime.   Yes [provider]  dronabinol (MARINOL) 2.5 MG capsule Take 1 capsule (2.5 mg total) by mouth 2 (two) times daily before a meal. 11/27/22  Yes Mahon, Courtney L, NP  escitalopram (LEXAPRO) 20 MG tablet Take 40 mg by mouth daily. 03/14/21  Yes [provider]  levothyroxine (SYNTHROID) 88 MCG tablet Take 88 mcg by mouth daily.  03/06/21  Yes [provider]  ondansetron (ZOFRAN) 8 MG tablet Take 8 mg by mouth every 8 (eight) hours as needed for nausea or vomiting.   Yes [provider]  Sod Picosulfate-Mag Ox-Cit Acd (CLENPIQ) 10-3.5-12 MG-GM -GM/175ML SOLN Take 1 kit by mouth as directed. 11/27/22   Corbin Ade, MD    Allergies as of 11/27/2022   (No Known Allergies)    Family History  Problem Relation Age of Onset   Colon cancer Paternal Grandfather    Colon polyps Neg Hx     Social History   Socioeconomic History   Marital status: Married    Spouse name: Not on file   Number of children: Not on file   Years of education: Not on file   Highest education level: Not on file  Occupational History   Not on file  Tobacco Use   Smoking status: Former    Packs/day: 0.50    Years: 20.00    Additional pack years: 0.00    Total pack years: 10.00    Types: Cigarettes    Quit date: 04/08/2020    Years since quitting: 2.7   Smokeless tobacco: Current   Tobacco comments:    Uses a vape daily  Vaping Use   Vaping Use: Every day  Substance and Sexual Activity   Alcohol use: Not Currently   Drug use: Not Currently   Sexual activity: Yes  Other Topics Concern   Not  on file  Social History Narrative   Not on file   Social Determinants of Health   Financial Resource Strain: Not on file  Food Insecurity: Not on file  Transportation Needs: Not on file  Physical Activity: Not on file  Stress: Not on file  Social Connections: Not on file  Intimate Partner Violence: Not on file    Review of Systems: See HPI, otherwise negative ROS  Physical Exam: BP 103/69   Pulse 62   Temp 98.2 F (36.8 C) (Oral)   Resp 12   Ht 5\' 2"  (1.575 m)   Wt 99.8 kg   LMP 12/26/2022   SpO2 96%   BMI 40.24 kg/m  General:   Alert,  Well-developed, well-nourished, pleasant and cooperative in NAD Skin:  Intact without significant lesions or rashes. Eyes:  Sclera clear, no icterus.   Conjunctiva  pink. Ears:  Normal auditory acuity. Nose:  No deformity, discharge,  or lesions. Mouth:  No deformity or lesions. Neck:  Supple; no masses or thyromegaly. No significant cervical adenopathy. Lungs:  Clear throughout to auscultation.   No wheezes, crackles, or rhonchi. No acute distress. Heart:  Regular rate and rhythm; no murmurs, clicks, rubs,  or gallops. Abdomen: Non-distended, normal bowel sounds.  Soft and nontender without appreciable mass or hepatosplenomegaly.  Pulses:  Normal pulses noted. Extremities:  Without clubbing or edema.  Impression/Plan: 42 year-old lady with profound weight loss positive family history of colon cancer second-degree relative. Here for an ileocolonoscopy to further evaluate as to the cause of weight loss discussed with patient and husband at length. The risks, benefits, limitations, alternatives and imponderables have been reviewed with the patient. Questions have been answered. All parties are agreeable.       Notice: This dictation was prepared with Dragon dictation along with smaller phrase technology. Any transcriptional errors that result from this process are unintentional and may not be corrected upon review.

## 2022-12-28 NOTE — Transfer of Care (Signed)
Immediate Anesthesia Transfer of Care Note  Patient: Cynthia Neal  Procedure(s) Performed: COLONOSCOPY WITH PROPOFOL  Patient Location: Endoscopy Unit  Anesthesia Type:General  Level of Consciousness: awake  Airway & Oxygen Therapy: Patient Spontanous Breathing  Post-op Assessment: Report given to RN and Post -op Vital signs reviewed and stable  Post vital signs: Reviewed and stable  Last Vitals:  Vitals Value Taken Time  BP    Temp    Pulse    Resp    SpO2      Last Pain:  Vitals:   12/28/22 0737  TempSrc:   PainSc: 0-No pain      Patients Stated Pain Goal: 7 (12/28/22 0655)  Complications: No notable events documented.

## 2022-12-28 NOTE — Op Note (Signed)
[facility] Patient Name: Cynthia Neal Procedure Date: 12/28/2022 7:16 AM MRN: [id number] Date of Birth: July 17, 1980 Attending MD: Gennette Pac , MD, [id number] CSN: [id number] Age: 42 Admit Type: Outpatient Procedure:                Colonoscopy Indications:              Weight loss, positive family history of colon cancer Providers:                Gennette Pac, MD, Buel Ream. Thomasena Edis RN, RN,                            [employer], [employer]. Jessee Avers, Technician,                            Dyann Ruddle Referring MD:              Medicines:                Propofol per Anesthesia Complications:            No immediate complications. Estimated Blood Loss:     Estimated blood loss: none. Procedure:                Pre-Anesthesia Assessment:                           - Prior to the procedure, a History and Physical                            was performed, and patient medications and                            allergies were reviewed. The patient's tolerance of                            previous anesthesia was also reviewed. The risks                            and benefits of the procedure and the sedation                            options and risks were discussed with the patient.                            All questions were answered, and informed consent                            was obtained. Prior Anticoagulants: The patient has                            taken no anticoagulant or antiplatelet agents. ASA                            Grade Assessment: II - A patient with mild systemic  disease. After reviewing the risks and benefits,                            the patient was deemed in satisfactory condition to                            undergo the procedure.                           After obtaining informed consent, the colonoscope                            was passed under direct vision. Throughout the                             procedure, the patient's blood pressure, pulse, and                            oxygen saturations were monitored continuously. The                            [id number]) scope was introduced through the                            anus and advanced to the the cecum, identified by                            appendiceal orifice and ileocecal valve. The                            colonoscopy was performed without difficulty. The                            patient tolerated the procedure well. The quality                            of the bowel preparation was adequate. The terminal                            ileum, ileocecal valve, appendiceal orifice, and                            rectum were photographed. The entire colon was well                            visualized. Scope In: 7:42:06 AM Scope Out: 7:57:08 AM Scope Withdrawal Time: 0 hours 10 minutes 34 seconds  Total Procedure Duration: 0 hours 15 minutes 2 seconds  Findings:      The perianal and digital rectal examinations were normal.      The colon (entire examined portion) appeared normal. Distal 10 cm of TI       appeared normal. Rectal vault too small to retroflex. Rectal mucosa seen       well on?"face. Impression:               -  The entire examined colon is normal.                            Normal-appearing terminal ileum                           - No specimens collected. Moderate Sedation:      Moderate (conscious) sedation was personally administered by an       anesthesia professional. The following parameters were monitored: oxygen       saturation, heart rate, blood pressure, respiratory rate, EKG, adequacy       of pulmonary ventilation, and response to care. Recommendation:           - Patient has a contact number available for                            emergencies. The signs and symptoms of potential                            delayed complications were discussed with the                             patient. Return to normal activities tomorrow.                            Written discharge instructions were provided to the                            patient.                           - Advance diet as tolerated.                           - Continue present medications.                           - Repeat colonoscopy in 10 years for screening                            purposes.                           - Return to GI office in 2 weeks. Procedure Code(s):        --- Professional ---                           [phone], Colonoscopy, flexible; diagnostic, including                            collection of specimen(s) by brushing or washing,                            when performed (separate procedure) Diagnosis Code(s):        --- Professional ---  R63.4, Abnormal weight loss CPT copyright 2022 American Medical Association. All rights reserved. The codes documented in this report are preliminary and upon coder review may  be revised to meet current compliance requirements. Gerrit Friends. Tylyn Stankovich, MD Gennette Pac, MD 12/28/2022 8:11:57 AM This report has been signed electronically. Number of Addenda: 0

## 2022-12-28 NOTE — Anesthesia Procedure Notes (Signed)
Date/Time: 12/28/2022 7:43 AM  Performed by: Julian Reil, CRNAPre-anesthesia Checklist: Patient identified, Emergency Drugs available, Suction available and Patient being monitored Patient Re-evaluated:Patient Re-evaluated prior to induction Oxygen Delivery Method: Nasal cannula Induction Type: IV induction Placement Confirmation: positive ETCO2

## 2022-12-28 NOTE — Anesthesia Postprocedure Evaluation (Signed)
Anesthesia Post Note  Patient: Cynthia Neal  Procedure(s) Performed: COLONOSCOPY WITH PROPOFOL  Patient location during evaluation: Phase II Anesthesia Type: General Level of consciousness: awake Pain management: pain level controlled Vital Signs Assessment: post-procedure vital signs reviewed and stable Respiratory status: spontaneous breathing and respiratory function stable Cardiovascular status: blood pressure returned to baseline and stable Postop Assessment: no headache and no apparent nausea or vomiting Anesthetic complications: no Comments: Late entry   No notable events documented.   Last Vitals:  Vitals:   12/28/22 0800 12/28/22 0803  BP: (!) 83/52 91/68  Pulse: 74   Resp: (!) 21   Temp: 36.5 C   SpO2: 100%     Last Pain:  Vitals:   12/28/22 0800  TempSrc: Oral  PainSc: 0-No pain                 Windell Norfolk

## 2022-12-28 NOTE — Anesthesia Preprocedure Evaluation (Signed)
Anesthesia Evaluation  Patient identified by MRN, date of birth, ID band Patient awake    Reviewed: Allergy & Precautions, H&P , NPO status , Patient's Chart, lab work & pertinent test results, reviewed documented beta blocker date and time   Airway Mallampati: II  TM Distance: >3 FB Neck ROM: full    Dental no notable dental hx.    Pulmonary neg pulmonary ROS, asthma , former smoker   Pulmonary exam normal breath sounds clear to auscultation       Cardiovascular Exercise Tolerance: Good negative cardio ROS  Rhythm:regular Rate:Normal     Neuro/Psych  Neuromuscular disease negative neurological ROS  negative psych ROS   GI/Hepatic negative GI ROS, Neg liver ROS,,,  Endo/Other  negative endocrine ROSHypothyroidism    Renal/GU negative Renal ROS  negative genitourinary   Musculoskeletal   Abdominal   Peds  Hematology negative hematology ROS (+)   Anesthesia Other Findings   Reproductive/Obstetrics negative OB ROS                             Anesthesia Physical Anesthesia Plan  ASA: 2  Anesthesia Plan: General   Post-op Pain Management:    Induction:   PONV Risk Score and Plan:   Airway Management Planned:   Additional Equipment:   Intra-op Plan:   Post-operative Plan:   Informed Consent: I have reviewed the patients History and Physical, chart, labs and discussed the procedure including the risks, benefits and alternatives for the proposed anesthesia with the patient or authorized representative who has indicated his/her understanding and acceptance.     Dental Advisory Given  Plan Discussed with: CRNA  Anesthesia Plan Comments:        Anesthesia Quick Evaluation

## 2022-12-28 NOTE — Discharge Instructions (Addendum)
  Colonoscopy Discharge Instructions  Read the instructions outlined below and refer to this sheet in the next few weeks. These discharge instructions provide you with general information on caring for yourself after you leave the hospital. Your doctor may also give you specific instructions. While your treatment has been planned according to the most current medical practices available, unavoidable complications occasionally occur. If you have any problems or questions after discharge, call Dr. Jena Gauss at [phone]. ACTIVITY You may resume your regular activity, but move at a slower pace for the next 24 hours.  Take frequent rest periods for the next 24 hours.  Walking will help get rid of the air and reduce the bloated feeling in your belly (abdomen).  No driving for 24 hours (because of the medicine (anesthesia) used during the test).   Do not sign any important legal documents or operate any machinery for 24 hours (because of the anesthesia used during the test).  NUTRITION Drink plenty of fluids.  You may resume your normal diet as instructed by your doctor.  Begin with a light meal and progress to your normal diet. Heavy or fried foods are harder to digest and may make you feel sick to your stomach (nauseated).  Avoid alcoholic beverages for 24 hours or as instructed.  MEDICATIONS You may resume your normal medications unless your doctor tells you otherwise.  WHAT YOU CAN EXPECT TODAY Some feelings of bloating in the abdomen.  Passage of more gas than usual.  Spotting of blood in your stool or on the toilet paper.  IF YOU HAD POLYPS REMOVED DURING THE COLONOSCOPY: No aspirin products for 7 days or as instructed.  No alcohol for 7 days or as instructed.  Eat a soft diet for the next 24 hours.  FINDING OUT THE RESULTS OF YOUR TEST Not all test results are available during your visit. If your test results are not back during the visit, make an appointment with your caregiver to find out the  results. Do not assume everything is normal if you have not heard from your caregiver or the medical facility. It is important for you to follow up on all of your test results.  SEEK IMMEDIATE MEDICAL ATTENTION IF: You have more than a spotting of blood in your stool.  Your belly is swollen (abdominal distention).  You are nauseated or vomiting.  You have a temperature over 101.  You have abdominal pain or discomfort that is severe or gets worse throughout the day.     Your colon and the end of your small intestine (terminal ileum) appeared normal  Recommend a repeat colonoscopy in 10 years for colon cancer screening purposes  Keep your office appointment with Brooke Bonito [place] coming up.  Patient request, I called Christiane Ha at [phone] -reviewed findings and recommendations

## 2023-01-04 ENCOUNTER — Encounter (HOSPITAL_COMMUNITY): Payer: Self-pay | Admitting: Internal Medicine

## 2023-01-11 ENCOUNTER — Ambulatory Visit: Payer: 59 | Admitting: Gastroenterology

## 2023-01-11 ENCOUNTER — Encounter: Payer: Self-pay | Admitting: Gastroenterology

## 2023-01-11 VITALS — BP 101/71 | HR 71 | Temp 98.2°F | Ht 62.0 in | Wt 226.0 lb

## 2023-01-11 DIAGNOSIS — R112 Nausea with vomiting, unspecified: Secondary | ICD-10-CM

## 2023-01-11 DIAGNOSIS — R634 Abnormal weight loss: Secondary | ICD-10-CM | POA: Diagnosis not present

## 2023-01-11 DIAGNOSIS — R630 Anorexia: Secondary | ICD-10-CM

## 2023-01-11 DIAGNOSIS — R6339 Other feeding difficulties: Secondary | ICD-10-CM

## 2023-01-11 NOTE — Patient Instructions (Addendum)
Continue your daily protein shakes, 1-2/day pending your ability to eat a water variety of foods.  Given that has been sometime I would not recommend that you slowly try to reintroduce things into your diet to see if you are able to keep them down or tolerate them.  I think you would benefit from some sort of biofeedback therapy in regards to introducing food to your diet.  I will try to discuss this with the dietitian to see if there are any active resources or see if this is a service that they can provide.   Please discuss with your primary care to see if weaning off Abilify improves any of your symptoms.  If this turns out to not be the case then I suspect that this is likely secondary to COVID and the effects of lack of smell and taste and has become a learned behavior that we can try to address with biofeedback.   You can stop the Marinol if you do not feel like this is helping.  Follow-up in 3 months, sooner if needed.  It was a pleasure to see you today. I want to create trusting relationships with patients. If you receive a survey regarding your visit,  I greatly appreciate you taking time to fill this out on paper or through your MyChart. I value your feedback.  Brooke Bonito, MSN, FNP-BC, AGACNP-BC [facility]

## 2023-01-11 NOTE — Progress Notes (Signed)
GI Office Note    Referring Provider: Lianne Moris, PA-C Primary Care Physician:  Lianne Moris, PA-C Primary Gastroenterologist: Gerrit Friends.Rourk, MD  Date:  01/11/2023  ID:  Cynthia Neal, DOB Mar 25, 1981, MRN [id number]   Chief Complaint   Chief Complaint  Patient presents with   Follow-up    Follow up after EGD and colonoscopy. Pt states she is the same as before the procedure   History of Present Illness  Cynthia Neal is a 42 y.o. female with a history of asthma, COVID, hypothyroidism, myasthenia gravis s/p thymectomy, and appendectomy 2018  presenting today for follow up with ongoing issues with food aversion and lack of appetite.   Review PCP: From visit dated 07/31/2022 patient presented with follow-up of hypothyroidism. Patient also with complaint of dysphagia has gotten worse since she had COVID as foods do not agree with her and she is gagging with certain foods and not sure if this is a texture issue..  For anxiety and depression she was added Abilify to her Lexapro.  Seeing a therapist.  No longer taking Adderall and has not been taking Toprol for palpitations.  Had CTA that was negative for PE for shortness of breath.  Send this is been ongoing and worsening since having COVID in December 2020.  Currently on Symbicort.  Taking iron daily for anemia.  No relief with omeprazole for GERD dysphagia nausea, using Zofran as needed.  Referred to GI.  Patient previously did states she was not taking omeprazole due to being unable to swallow it due to gagging.  It appears it was also present at her visit in December 2023.   Labs December 2023 with albumin 3.4, normal LFTs and renal function.  Iron panel within normal limits.  Hemoglobin 11.5.  TSH 9.6.  Lipid panel within normal limits.   Initial office visit 09/22/2022. Has been dealing with symptoms of long COVID and has not had return of her smell or taste.  Has a constant feeling of something stuck in her throat after swallowing  and usually only occurs with solids.  No recent changes in bowel habits, alternates between constipation and diarrhea.  Denied any reflux episodes and had tried omeprazole for 2 weeks but did not seem to make a difference with her ability to eat.  She has had a lack of appetite and gagging at the site of food is into the tissues her tongue.  She has been eating very small amounts per day and this has been going on since Thanksgiving.  She reports about a 60 pound weight loss.  Also having nausea on a daily basis and at times mild vomiting.  Has to force herself to eat.  Interestingly she is able to eat chicken, steak, hamburger without any issue however other textures such as vegetables or anything soft/mushy/greasy makes her regurgitate.Will check CTA A/P and chest x-ray, CBC, CMP, CRP, hep C antibody.  Scheduled for EGD/ED/TCS.  Take omeprazole once daily, may open capsule and swallowed contents.  Advised daily protein shake monitor weight closely at home.  Denies of EGD negative and no improvement in weight will likely need to pursue colonoscopy.   Labs 09/28/2022: Normal CBC, CRP.  Negative for hep C.  CMP with BUN 5, albumin 3.5, otherwise normal.   EGD 10/07/22: -Normal esophagus s/p dilation and biopsy -Abnormal gastric mucosa (diffuse erosion and patchy erythema) s/p biopsy -Normal duodenum -Advance advance diet as tolerated and continue omeprazole (may dissolve contents in water) -Gastric biopsies with focal chronic inactive  gastritis negative for H. Pylori -Esophageal biopsies without significant alteration   CTA A/P 10/30/2022: -No evidence of significant atherosclerotic plaque, stenosis, or occlusion to suggest mesenteric ischemia -No acute abnormality within the abdomen or pelvis -Surgical changes of prior cholecystectomy and appendectomy.  Last office visit 11/17/22.  Dysphagia improved since EGD.  Taking Zofran 8 mg daily without any real change in symptoms.  Has not really tried any new  foods.  Still wants to gag with certain types of food.  Drinking protein shake daily but only eating about a handful of meal that is usually meat and rice with cheese.  Continues to have regurgitation/gagging with food that she likes to eat usually.  Has random abdominal pain.  Drinks Ensure max usually when she does have protein shake daily.  No recent medication changes.  Advised to proceed with colonoscopy and consider testing for adrenal insufficiency.  Start zinc supplement daily.  Increase to 2 ensures daily.  Continue omeprazole but open capsule to swallow contents.  Send in dronabinol to take twice a day for appetite stimulant given Remeron is contraindicated with Lexapro and Abilify.  Colonoscopy 12/28/22: -Normal colon -Normal TI -Rectal vault too small to retroflex  Today:  Still mostly eating just meat and 2 protein shakes daily. Has food aversion still but not significant nausea. The nausea has gotten better.  Has been able to tolerate rice.   She used to love salads and since this started she has biot been able to eat those, chicken and dumplings. Even thinking about it makes her want to gag.   Has been on Lexapro for about 6 years. Has been on Abilify for about the last year.  Can taste sweet and salty and spicy but does not get an actual taste of the food.   Cortisol   Wt Readings from Last 3 Encounters:  01/11/23 226 lb (102.5 kg)  12/28/22 220 lb (99.8 kg)  11/17/22 224 lb 12.8 oz (102 kg)  10/06/22  230 lb   Current Outpatient Medications  Medication Sig Dispense Refill   albuterol (VENTOLIN HFA) 108 (90 Base) MCG/ACT inhaler Inhale 2 puffs into the lungs every 6 (six) hours as needed for wheezing or shortness of breath.     ARIPiprazole (ABILIFY) 5 MG tablet Take 5 mg by mouth at bedtime.     dronabinol (MARINOL) 2.5 MG capsule Take 1 capsule (2.5 mg total) by mouth 2 (two) times daily before a meal. 60 capsule 0   escitalopram (LEXAPRO) 20 MG tablet Take 40 mg by  mouth daily.     levothyroxine (SYNTHROID) 88 MCG tablet Take 88 mcg by mouth daily.     ondansetron (ZOFRAN) 8 MG tablet Take 8 mg by mouth every 8 (eight) hours as needed for nausea or vomiting.     No current facility-administered medications for this visit.    Past Medical History:  Diagnosis Date   Asthma    COVID-19 05/2019   Hypothyroidism    Myasthenia (HCC)    in remission s/p thymectomy   SOB (shortness of breath)    Vertigo     Past Surgical History:  Procedure Laterality Date   APPENDECTOMY  03/2017   BIOPSY  10/07/2022   Procedure: BIOPSY;  Surgeon: Corbin Ade, MD;  Location: [facility];  Service: Endoscopy;;   CHOLECYSTECTOMY  2002   COLONOSCOPY WITH PROPOFOL N/A 12/28/2022   Procedure: COLONOSCOPY WITH PROPOFOL;  Surgeon: Corbin Ade, MD;  Location: [facility];  Service: Endoscopy;  Laterality: N/A;  730am, asa 2   ESOPHAGOGASTRODUODENOSCOPY (EGD) WITH PROPOFOL N/A 10/07/2022   Procedure: ESOPHAGOGASTRODUODENOSCOPY (EGD) WITH PROPOFOL;  Surgeon: Corbin Ade, MD;  Location: [facility];  Service: Endoscopy;  Laterality: N/A;  1:30 PM; ASA 2   MALONEY DILATION N/A 10/07/2022   Procedure: MALONEY DILATION;  Surgeon: Corbin Ade, MD;  Location: [facility];  Service: Endoscopy;  Laterality: N/A;  1:30PM;ASA 2   THYMECTOMY  2005   TUBAL LIGATION Bilateral 2011    Family History  Problem Relation Age of Onset   Colon cancer Paternal Grandfather    Colon polyps Neg Hx     Allergies as of 01/11/2023   (No Known Allergies)    Social History   Socioeconomic History   Marital status: Married    Spouse name: Not on file   Number of children: Not on file   Years of education: Not on file   Highest education level: Not on file  Occupational History   Not on file  Tobacco Use   Smoking status: Former    Current packs/day: 0.00    Average packs/day: 0.5 packs/day for 20.0 years (10.0 ttl pk-yrs)    Types: Cigarettes    Start date:  04/08/2000    Quit date: 04/08/2020    Years since quitting: 2.7   Smokeless tobacco: Current   Tobacco comments:    Uses a vape daily  Vaping Use   Vaping status: Every Day  Substance and Sexual Activity   Alcohol use: Not Currently   Drug use: Not Currently   Sexual activity: Yes  Other Topics Concern   Not on file  Social History Narrative   Not on file   Social Determinants of Health   Financial Resource Strain: Not on file  Food Insecurity: Not on file  Transportation Needs: Not on file  Physical Activity: Not on file  Stress: Not on file  Social Connections: Not on file     Review of Systems   Gen: Denies fever, chills, anorexia. Denies fatigue, weakness, weight loss.  CV: Denies chest pain, palpitations, syncope, peripheral edema, and claudication. Resp: Denies dyspnea at rest, cough, wheezing, coughing up blood, and pleurisy. GI: See HPI Derm: Denies rash, itching, dry skin Psych: Denies depression, anxiety, memory loss, confusion. No homicidal or suicidal ideation.  Heme: Denies bruising, bleeding, and enlarged lymph nodes.  Physical Exam   BP 101/71   Pulse 71   Temp 98.2 F (36.8 C)   Ht 5\' 2"  (1.575 m)   Wt 226 lb (102.5 kg)   LMP 12/26/2022   BMI 41.34 kg/m   General:   Alert and oriented. No distress noted. Pleasant and cooperative.  Head:  Normocephalic and atraumatic. Eyes:  Conjuctiva clear without scleral icterus. Mouth:  Oral mucosa pink and moist. Good dentition. No lesions. Lungs:  Clear to auscultation bilaterally. No wheezes, rales, or rhonchi. No distress.  Heart:  S1, S2 present without murmurs appreciated.  Abdomen:  +BS, soft,  non-distended. Mild ttp to epigastrium. No rebound or guarding. No HSM or masses noted. Rectal: deferred Msk:  Symmetrical without gross deformities. Normal posture. Extremities:  Without edema. Neurologic:  Alert and  oriented x4 Psych:  Alert and cooperative. Normal mood and affect.  Assessment   Cynthia Neal is a 42 y.o. female with a history of asthma, COVID, hypothyroidism, myasthenia gravis s/p thymectomy, and appendectomy 2018  presenting today for follow up with ongoing issues with food aversion and lack of appetite.  Nausea, lack of appetite, weight loss: Weight has improved since last visit.  She is drinking about 2 protein shakes daily and is able to eat a little bit of meat still along with some rice.  Has not tried any new foods or reintroduced any of her prior favorite foods recently therefore has not had any significant gagging episodes.  Has been forcing herself to eat a little bit as she is eating as well as the protein shakes as her appetite still is nonexistent.  Has not had any relief with appetite stimulants.  Reassuringly EGD, colonoscopy, and CT scan have all been unremarkable.  Reported normal cortisol level with PCP.  Will plan to discuss gagging/food aversion discuss with the dietitian to see if there are any active resources or see if biofeedback therapy is a service that they can provide.   PLAN   Continue protein shakes daily.  Stop dronabinol Consider things back into diet and monitor for symptoms.  Consider weaning off Abilify - discuss with PCP Look into resources for biofeedback therapy Follow up in 3 months    Brooke Bonito, MSN, FNP-BC, AGACNP-BC [facility]

## 2023-01-12 ENCOUNTER — Ambulatory Visit: Payer: 59 | Admitting: Gastroenterology

## 2023-03-17 ENCOUNTER — Encounter: Payer: Self-pay | Admitting: Internal Medicine

## 2023-09-07 ENCOUNTER — Other Ambulatory Visit: Payer: Self-pay | Admitting: Family Medicine

## 2023-09-07 DIAGNOSIS — R29898 Other symptoms and signs involving the musculoskeletal system: Secondary | ICD-10-CM

## 2023-09-07 DIAGNOSIS — M5416 Radiculopathy, lumbar region: Secondary | ICD-10-CM

## 2023-09-07 DIAGNOSIS — M545 Low back pain, unspecified: Secondary | ICD-10-CM

## 2023-09-19 ENCOUNTER — Other Ambulatory Visit
